# Patient Record
Sex: Male | Born: 1990 | Race: Black or African American | Hispanic: No | Marital: Married | Smoking: Former smoker
Health system: Southern US, Community
[De-identification: ages and names within clinical notes are randomized; demographics above are authoritative.]

## PROBLEM LIST (undated history)

## (undated) DIAGNOSIS — J309 Allergic rhinitis, unspecified: Secondary | ICD-10-CM

## (undated) DIAGNOSIS — S93326A Dislocation of tarsometatarsal joint of unspecified foot, initial encounter: Secondary | ICD-10-CM

## (undated) DIAGNOSIS — J3501 Chronic tonsillitis: Secondary | ICD-10-CM

## (undated) HISTORY — DX: Chronic tonsillitis: J35.01

## (undated) HISTORY — DX: Dislocation of tarsometatarsal joint of unspecified foot, initial encounter: S93.326A

## (undated) HISTORY — PX: NO PAST SURGERIES: SHX2092

## (undated) HISTORY — DX: Allergic rhinitis, unspecified: J30.9

---
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
## 2012-03-22 ENCOUNTER — Encounter (HOSPITAL_BASED_OUTPATIENT_CLINIC_OR_DEPARTMENT_OTHER): Payer: Self-pay | Admitting: *Deleted

## 2012-03-22 ENCOUNTER — Emergency Department (INDEPENDENT_AMBULATORY_CARE_PROVIDER_SITE_OTHER): Payer: Managed Care, Other (non HMO)

## 2012-03-22 ENCOUNTER — Emergency Department (HOSPITAL_BASED_OUTPATIENT_CLINIC_OR_DEPARTMENT_OTHER)
Admission: EM | Admit: 2012-03-22 | Discharge: 2012-03-22 | Disposition: A | Discharge status: Home / Self Care | Payer: Managed Care, Other (non HMO) | Attending: Emergency Medicine | Admitting: Emergency Medicine

## 2012-03-22 DIAGNOSIS — J40 Bronchitis, not specified as acute or chronic: Secondary | ICD-10-CM | POA: Insufficient documentation

## 2012-03-22 DIAGNOSIS — R509 Fever, unspecified: Secondary | ICD-10-CM

## 2012-03-22 DIAGNOSIS — R05 Cough: Secondary | ICD-10-CM

## 2012-03-22 MED ORDER — ALBUTEROL SULFATE HFA 108 (90 BASE) MCG/ACT IN AERS
2.0000 | INHALATION_SPRAY | RESPIRATORY_TRACT | Status: DC | PRN
  Administered 2012-03-22: 2 via RESPIRATORY_TRACT
  Filled 2012-03-22: qty 6.7

## 2012-03-22 NOTE — ED Provider Notes (Signed)
History     CSN: [id number]  Arrival date & time 03/22/12  1311   First MD Initiated Contact with Patient 03/22/12 1346      Chief Complaint  Patient presents with  . Fever    (Consider location/radiation/quality/duration/timing/severity/associated sxs/prior treatment) HPI Pt presents with c/o cough, nasal congestion, body aches and subjective fever.  Symptoms began this morning.  He has not taken anything for his symptoms prior to arrival.  Symptoms described as mild.  He has been able to drink and eat normal, no difficulty breathing or swallowing.  He has no specific sick contacts.  There are no alleviating or modifying factors.  Symptoms are continuous, cough is nonproductive.  History reviewed. No pertinent past medical history.  History reviewed. No pertinent past surgical history.  No family history on file.  History  Substance Use Topics  . Smoking status: Never Smoker   . Smokeless tobacco: Not on file  . Alcohol Use: No      Review of Systems ROS reviewed and all otherwise negative except for mentioned in HPI  Allergies  Review of patient's allergies indicates no known allergies.  Home Medications  No current outpatient prescriptions on file.  BP 119/66  Pulse 84  Temp(Src) 99.8 F (37.7 C) (Oral)  Resp 18  SpO2 100% Vitals reviewed Physical Exam Physical Examination: General appearance - alert, well appearing, and in no distress Mental status - alert, oriented to person, place, and time Mouth - mucous membranes moist, pharynx normal without lesions Neck - supple, no significant adenopathy Chest - clear to auscultation, no wheezes, rales or rhonchi, symmetric air entry, no increased respiratory effort or dyspnea Heart - normal rate, regular rhythm, normal S1, S2, no murmurs, rubs, clicks or gallops Abdomen - soft, nontender, nondistended, no masses or organomegaly Extremities - peripheral pulses normal, no pedal edema, no clubbing or cyanosis Skin -  normal coloration and turgor, no rashes, brisk cap refill  ED Course  Procedures (including critical care time)  Labs Reviewed - No data to display Dg Chest 2 View  03/22/2012  *RADIOLOGY REPORT*  Clinical Data: Fever with cough  CHEST - 2 VIEW  Comparison: None.  Findings: Heart and mediastinal contours are within normal limits. The lung fields appear clear with no signs of focal infiltrate or congestive failure.  No pleural fluid or significant peribronchial cuffing is seen.  Bony structures appear intact  IMPRESSION: No worrisome focal or acute cardiopulmonary abnormality noted.  Original Report Authenticated By: Bertha Stakes, M.D.     1. Bronchitis       MDM  Pt presents with nasal congestion,cough, subjective fever.  CXR reveals no acute abnormalities, pt is not in any distress and overall appears nontoxic and well hydrated.  He was provided with an albuterol for symptomatic relief of possible bronchitis.  Also discussed symptomatic treatment and the importance of hydration. Pt discharged with strict return precautions.  He is agreeable with this plan.         Ethelda Chick, MD 03/22/12 [phone]

## 2012-03-22 NOTE — ED Notes (Signed)
Pt states one day history of fever chills and cough.

## 2012-03-22 NOTE — Discharge Instructions (Signed)
Return to the ED with any concerns including difficulty breathing or swallowing, vomiting and not able to keep down liquids, decreased level of alertness/lethargy, or any other alarming symptoms  You should use the albuterol 2 puffs every 4 hours for cough  Be sure to drink plenty of fluids and arrange for a recheck with your primary care doctor if your symptoms do not improve

## 2012-03-22 NOTE — ED Notes (Signed)
Woke with a fever, cough and headache.  Has not taken any otc medications.

## 2012-07-10 IMAGING — CR DG FOOT COMPLETE 3+V*R*
3 series · 3 of 3 positions shown · non-contrast
Comparison: None.

CLINICAL DATA: Twisted foot/ankle yesterday.  Distal lateral
metatarsal pain.

RIGHT FOOT COMPLETE - 3+ VIEW

[t foot ap right]
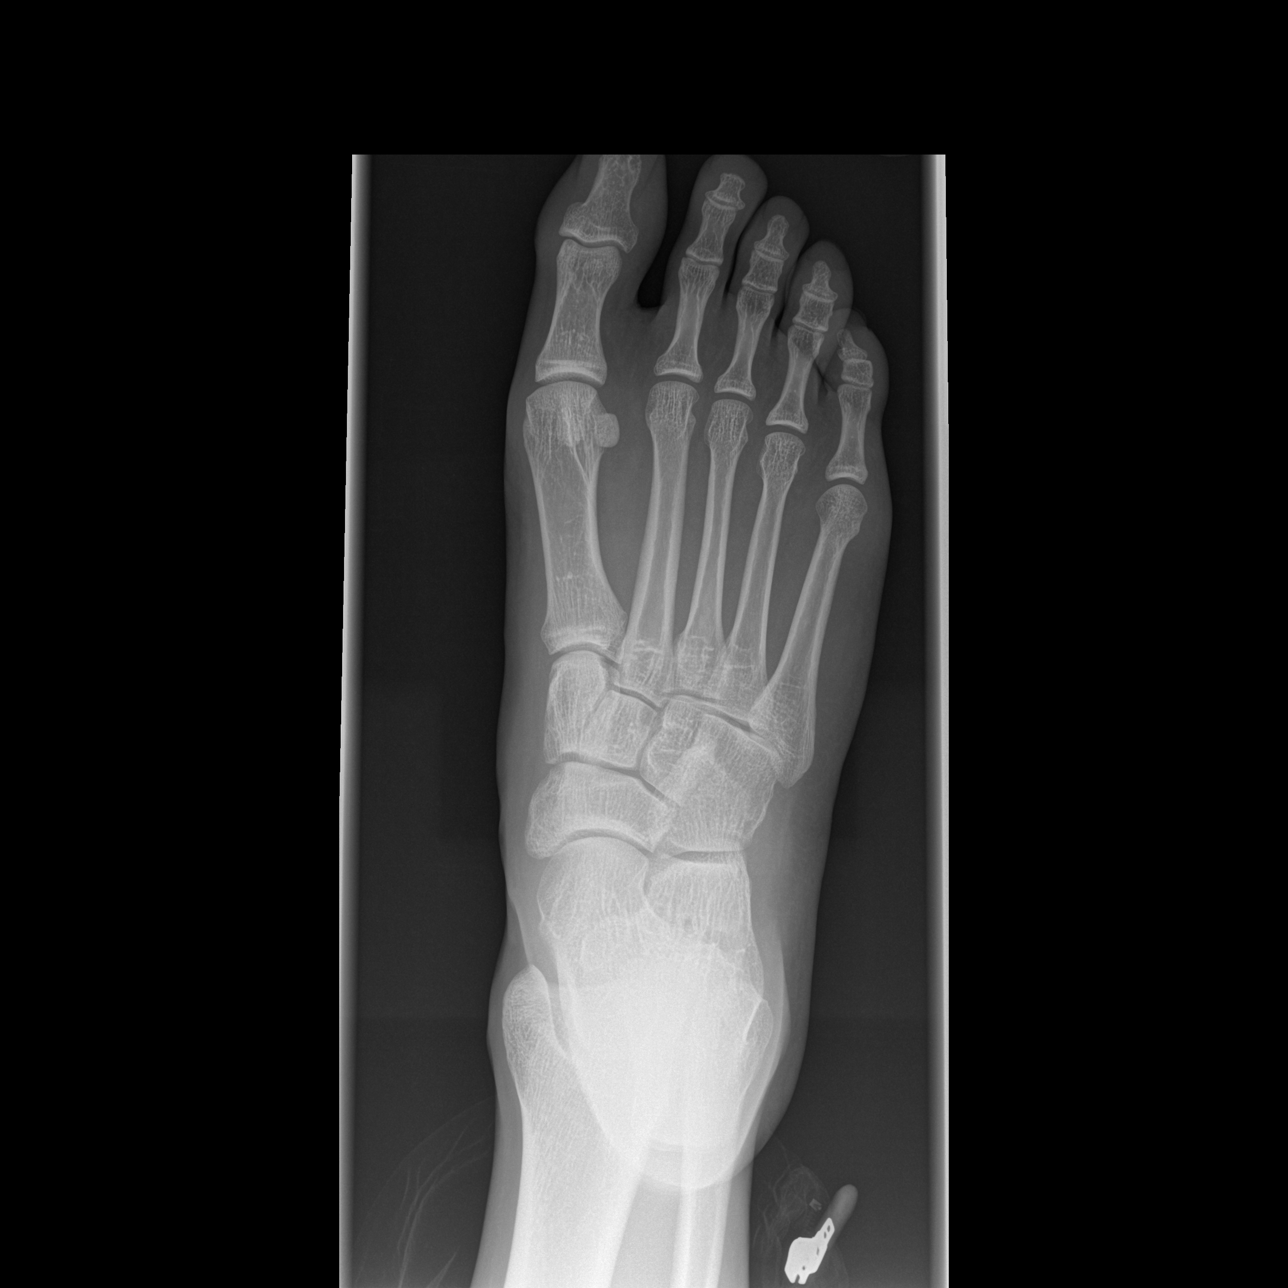

[t foot oblique right]
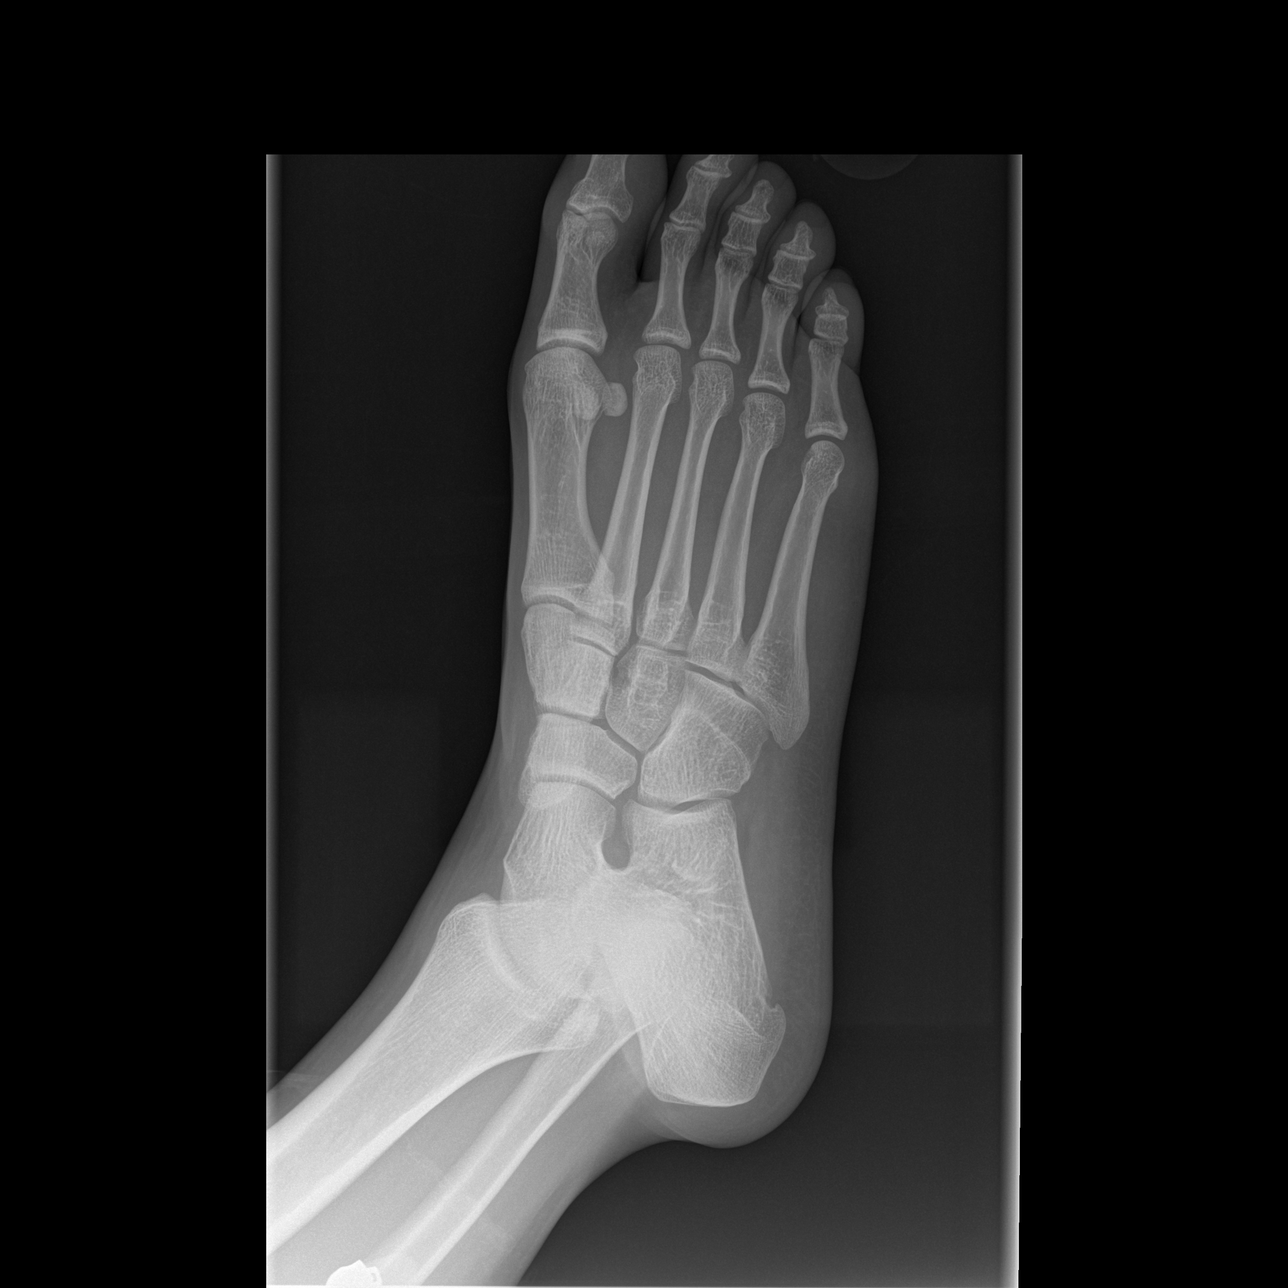

[t foot lat right]
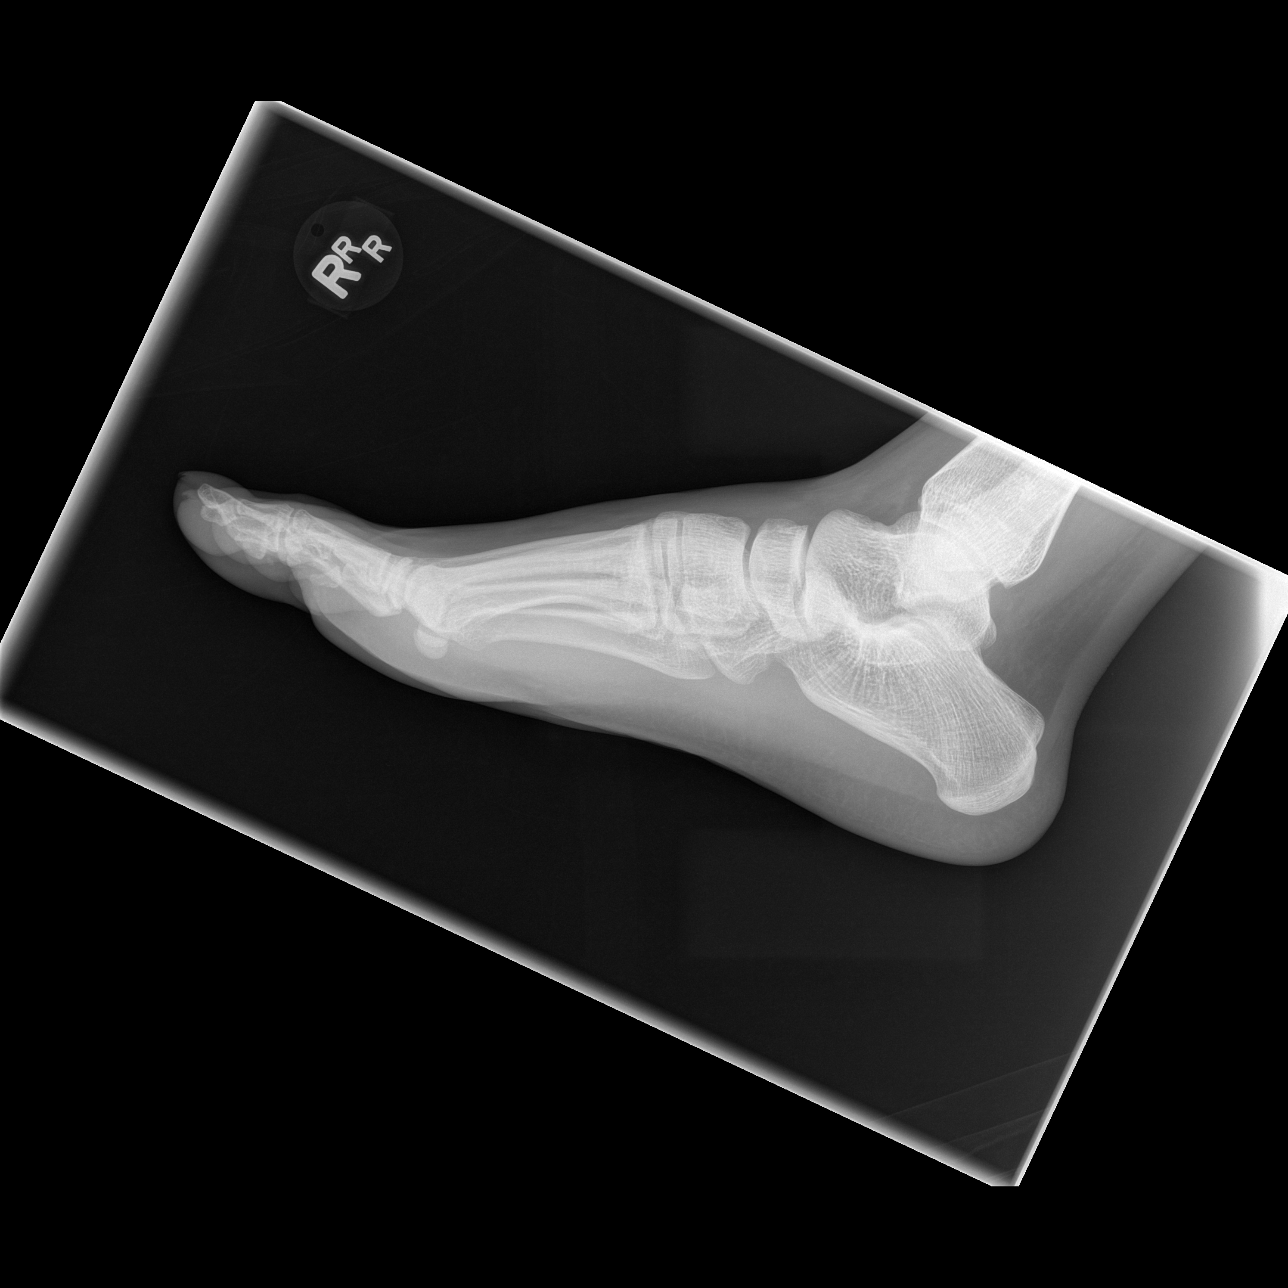

[3 of 3 positions shown; findings below may reference images not displayed]

FINDINGS: No acute fracture or dislocation.  No significant soft
tissue swelling.
IMPRESSION: No acute findings about the right foot.

## 2012-09-20 ENCOUNTER — Ambulatory Visit (INDEPENDENT_AMBULATORY_CARE_PROVIDER_SITE_OTHER): Payer: Managed Care, Other (non HMO) | Admitting: *Deleted

## 2012-09-20 DIAGNOSIS — Z23 Encounter for immunization: Secondary | ICD-10-CM

## 2012-09-20 DIAGNOSIS — Z0000 Encounter for general adult medical examination without abnormal findings: Secondary | ICD-10-CM

## 2013-02-17 ENCOUNTER — Ambulatory Visit (INDEPENDENT_AMBULATORY_CARE_PROVIDER_SITE_OTHER): Payer: Managed Care, Other (non HMO) | Admitting: Internal Medicine

## 2013-02-17 ENCOUNTER — Encounter: Payer: Self-pay | Admitting: Internal Medicine

## 2013-02-17 VITALS — BP 112/74 | HR 58 | Temp 97.9°F | Wt 172.0 lb

## 2013-02-17 DIAGNOSIS — H6120 Impacted cerumen, unspecified ear: Secondary | ICD-10-CM

## 2013-02-17 DIAGNOSIS — R109 Unspecified abdominal pain: Secondary | ICD-10-CM

## 2013-02-17 DIAGNOSIS — J309 Allergic rhinitis, unspecified: Secondary | ICD-10-CM

## 2013-02-17 MED ORDER — FLUTICASONE PROPIONATE 50 MCG/ACT NA SUSP: 2.0000 | Freq: Every day | NASAL | Status: DC

## 2013-02-17 NOTE — Progress Notes (Signed)
  Subjective:    Patient ID: Ronald Patrick, male    DOB: April 06, 1991, 22 y.o.   MRN: [id number]  HPI Acute visit, mom is in the lobby, she is concerned about the following issues: Several weeks history of clearing his throat frequently. Review of systems negative, see below. Also, 2 weeks ago had an episode of abdominal pain, ill-defined, lasted a few hours, in the middle of the abdomen. No further episodes. Mom also says that he "takes a long time in the bathroom", pt denies constipation, nl BMs qd   Past Medical History  Diagnosis Date  . Chronic tonsillitis    Past Surgical History  Procedure Laterality Date  . No past surgeries     History   Social History  . Marital Status: Single    Spouse Name: N/A    Number of Children: 0  . Years of Education: N/A   Occupational History  . to start [employer] 07-2013    Social History Main Topics  . Smoking status: Never Smoker   . Smokeless tobacco: Never Used  . Alcohol Use: No  . Drug Use: No  . Sexually Active: Not on file   Other Topics Concern  . Not on file   Social History Narrative   Finished high school 2012     Review of Systems Denies runny nose, sore throat. No sneezing, itchy eyes or itchy nose. No heartburn per se. Denies any nausea, vomiting, diarrhea. Appetite is okay.    Objective:   Physical Exam General -- alert, well-developed, healthy appearing.    HEENT -- ++ wax on the R, normal TM on the left. Throat w/o redness, face symmetric and not tender to palpation, nose clear Lungs -- normal respiratory effort, no intercostal retractions, no accessory muscle use, and normal breath sounds.   Heart-- normal rate, regular rhythm, no murmur, and no gallop.   Abdomen--soft, non-tender, no distention, no masses, no HSM, no guarding, and no rigidity.   Extremities-- no pretibial edema bilaterally  Neurologic-- alert & oriented X3 and strength normal in all extremities. Psych-- Cognition and judgment appear intact.  Alert and cooperative with normal attention span and concentration.  not anxious appearing and not depressed appearing.       Assessment & Plan:  --"Clearing her throat ", etiology unclear, allergies?. Trial with Flonase. --Cerumen impaction,  Lavage performed, normal ear past lavage recommend to use peroxide as needed. --Single episode of abdominal pain, resolved.

## 2013-02-17 NOTE — Patient Instructions (Addendum)
Flonase, 2 sprays on each side of the nose daily for at least 6 weeks. Use peroxide, few drops in each ear 3 times a week to prevent wax accumulation. Please schedule a physical at your convenience

## 2013-04-10 ENCOUNTER — Ambulatory Visit: Payer: Managed Care, Other (non HMO) | Admitting: Family Medicine

## 2013-04-15 ENCOUNTER — Emergency Department (HOSPITAL_BASED_OUTPATIENT_CLINIC_OR_DEPARTMENT_OTHER): Payer: Managed Care, Other (non HMO)

## 2013-04-15 ENCOUNTER — Encounter (HOSPITAL_BASED_OUTPATIENT_CLINIC_OR_DEPARTMENT_OTHER): Payer: Self-pay

## 2013-04-15 ENCOUNTER — Emergency Department (HOSPITAL_BASED_OUTPATIENT_CLINIC_OR_DEPARTMENT_OTHER)
Admission: EM | Admit: 2013-04-15 | Discharge: 2013-04-15 | Disposition: A | Discharge status: Home / Self Care | Payer: Managed Care, Other (non HMO) | Attending: Emergency Medicine | Admitting: Emergency Medicine

## 2013-04-15 DIAGNOSIS — R059 Cough, unspecified: Secondary | ICD-10-CM | POA: Insufficient documentation

## 2013-04-15 DIAGNOSIS — J309 Allergic rhinitis, unspecified: Secondary | ICD-10-CM | POA: Insufficient documentation

## 2013-04-15 DIAGNOSIS — R05 Cough: Secondary | ICD-10-CM | POA: Insufficient documentation

## 2013-04-15 DIAGNOSIS — Z8709 Personal history of other diseases of the respiratory system: Secondary | ICD-10-CM | POA: Insufficient documentation

## 2013-04-15 DIAGNOSIS — R0982 Postnasal drip: Secondary | ICD-10-CM | POA: Insufficient documentation

## 2013-04-15 DIAGNOSIS — J3489 Other specified disorders of nose and nasal sinuses: Secondary | ICD-10-CM | POA: Insufficient documentation

## 2013-04-15 NOTE — ED Notes (Signed)
Mother states that pt has had dry hacking cough x1 month.  Has been to PCP and was given medications but sx are not improving.

## 2013-04-15 NOTE — ED Provider Notes (Signed)
History     CSN: [id number]  Arrival date & time 04/15/13  1415   First MD Initiated Contact with Patient 04/15/13 1416      Chief Complaint  Patient presents with  . Cough    (Consider location/radiation/quality/duration/timing/severity/associated sxs/prior treatment) Patient is a 22 y.o. male presenting with cough. The history is provided by the patient. No language interpreter was used.  Cough Associated symptoms: rhinorrhea   Associated symptoms: no chest pain, no chills, no diaphoresis, no ear pain, no fever, no shortness of breath and no sore throat   Pt is a 22yo male c/o dry hacking cough x56mo.  Pt accompanied by mother who is nervous the cough has lasted for 2mo.  State he has tried Claritin and Claritin D prescribed by PCP, with some relief.  Was also prescribed Flonase but never tried.  States he did have a headache and sore throat when cough first started last month but both have gone away after using Claritin.  Reports nasal congestion. Denies ear pain, chest pain, SOB, or abdominal pain.  Denies fever, n/v/d.  No hx of asthma.  States he is generally healthy.  Has not tried anything more for the cough.  No recent travel or sick contacts.     Past Medical History  Diagnosis Date  . Chronic tonsillitis     Past Surgical History  Procedure Laterality Date  . No past surgeries      History reviewed. No pertinent family history.  History  Substance Use Topics  . Smoking status: Never Smoker   . Smokeless tobacco: Never Used  . Alcohol Use: No      Review of Systems  Constitutional: Negative for fever, chills, diaphoresis and fatigue.  HENT: Positive for congestion, rhinorrhea and postnasal drip. Negative for ear pain, sore throat, sneezing, trouble swallowing, voice change and sinus pressure.   Respiratory: Positive for cough. Negative for chest tightness, shortness of breath and stridor.   Cardiovascular: Negative for chest pain.  Gastrointestinal: Negative  for nausea, vomiting, abdominal pain and diarrhea.    Allergies  Review of patient's allergies indicates no known allergies.  Home Medications   Current Outpatient Rx  Name  Route  Sig  Dispense  Refill  . loratadine (CLARITIN) 10 MG tablet   Oral   Take 10 mg by mouth daily.         . fluticasone (FLONASE) 50 MCG/ACT nasal spray   Nasal   Place 2 sprays into the nose daily.   16 g   6     BP 108/69  Pulse 69  Temp(Src) 98.4 F (36.9 C) (Oral)  Resp 18  Ht 6\' 1"  (1.854 m)  Wt 165 lb (74.844 kg)  BMI 21.77 kg/m2  SpO2 98%  Physical Exam  Nursing note and vitals reviewed. Constitutional: He appears well-developed and well-nourished. No distress.  HENT:  Head: Normocephalic and atraumatic. No trismus in the jaw.  Right Ear: Hearing, tympanic membrane, external ear and ear canal normal. Tympanic membrane is not erythematous. No decreased hearing is noted.  Left Ear: Hearing, tympanic membrane, external ear and ear canal normal. Tympanic membrane is not erythematous. No decreased hearing is noted.  Nose: Mucosal edema present. No rhinorrhea, sinus tenderness, septal deviation or nasal septal hematoma. No epistaxis.  No foreign bodies. Right sinus exhibits no maxillary sinus tenderness and no frontal sinus tenderness. Left sinus exhibits no maxillary sinus tenderness and no frontal sinus tenderness.  Mouth/Throat: Uvula is midline and mucous membranes are normal.  He does not have dentures. No oral lesions. Normal dentition. No dental abscesses, edematous, lacerations or dental caries. Posterior oropharyngeal edema ( mild) and posterior oropharyngeal erythema ( mild) present. No oropharyngeal exudate or tonsillar abscesses.  Eyes: Conjunctivae are normal. Right eye exhibits no discharge. Left eye exhibits no discharge. No scleral icterus.  Neck: Normal range of motion. Neck supple. No JVD present. No tracheal deviation present. No thyromegaly present.  Cardiovascular: Normal  rate, regular rhythm and normal heart sounds.   Pulmonary/Chest: Effort normal and breath sounds normal. No stridor. No respiratory distress. He has no wheezes. He has no rales. He exhibits no tenderness.  Abdominal: Soft. Bowel sounds are normal. He exhibits no distension. There is no tenderness.  Musculoskeletal: Normal range of motion.  Lymphadenopathy:    He has no cervical adenopathy.  Neurological: He is alert.  Skin: Skin is warm and dry. He is not diaphoretic.    ED Course  Procedures (including critical care time)  Labs Reviewed - No data to display Dg Chest 2 View  04/15/2013  *RADIOLOGY REPORT*  Clinical Data: Cough, congestion and shortness of breath.  CHEST - 2 VIEW  Comparison: 03/22/2012  Findings: The cardiomediastinal silhouette is unremarkable. Mild peribronchial thickening is unchanged. There is no evidence of focal airspace disease, pulmonary edema, suspicious pulmonary nodule/mass, pleural effusion, or pneumothorax. No acute bony abnormalities are identified.  IMPRESSION: No evidence of active cardiopulmonary disease.   Original Report Authenticated By: Harmon Pier, M.D.      1. Allergic rhinitis   2. Cough       MDM  Pt c/o 45mo dry hacking cough,  Has noticed some relief with Claritin D prescribed by PCP.  Was prescribed Flonase but never used it.  Accompanied by mother who wants to make sure nothing serious is going on.  Denies hx of asthma.  Denies daily medications besides Claritin.  Denies fever, n/v/d, sore throat, or headaches.  PE: well appearing, Ears/TMs-nl, Nose-erythemic, edematous mucosa, Throat-mild erythema and tonsillar hypertrophy w/o exudate. Neck-FROM, supple. Heart-nl, Lungs-CTAB, no wheezes or rhonchi.  No chest tenderness. Abd-nl  Believe cough is due to post-nasal drip and allergic rhinitis.  Will obtain CXR to ensure not pneumonia.  Not concerned for meningitis.   Rx: none- advised pt to continue taking recently prescribed Claritin and start  taking Flonase as prescribed by Dr. Drue Novel, PCP.    Reassured pt and mother that cough is likely due to allergic rhinitis.  CXR was normal.  Cough is non-productive.  Pt not c/o fever, headache or sinus pressure.  Pt does have inflammed nasal mucosa and visible post-nasal drip.  Gave pt, pt education for allergic rhinitis including saline rinses.  Will have pt f/u with Dr. Drue Novel in 2wks if symptoms persistent after consistent use of prescribed medications.   Vitals: unremarkable. Discharged in stable condition.    Discussed pt with attending during ED encounter.             Junius Finner, PA-C 04/15/13 1511

## 2013-04-17 NOTE — ED Provider Notes (Signed)
Medical screening examination/treatment/procedure(s) were performed by non-physician practitioner and as supervising physician I was immediately available for consultation/collaboration.  Orey Moure, MD 04/17/13 0809 

## 2013-04-29 ENCOUNTER — Ambulatory Visit (INDEPENDENT_AMBULATORY_CARE_PROVIDER_SITE_OTHER): Payer: Managed Care, Other (non HMO) | Admitting: Internal Medicine

## 2013-04-29 ENCOUNTER — Encounter: Payer: Self-pay | Admitting: Internal Medicine

## 2013-04-29 VITALS — BP 110/70 | HR 70 | Temp 98.1°F | Resp 16 | Ht 72.0 in | Wt 164.1 lb

## 2013-04-29 DIAGNOSIS — Z0000 Encounter for general adult medical examination without abnormal findings: Secondary | ICD-10-CM

## 2013-04-29 DIAGNOSIS — J309 Allergic rhinitis, unspecified: Secondary | ICD-10-CM

## 2013-04-29 DIAGNOSIS — Z23 Encounter for immunization: Secondary | ICD-10-CM

## 2013-04-29 NOTE — Assessment & Plan Note (Signed)
Immunizations, he is update except for Menactra which is provided today Counseling about diet, exercise, safe driving, safe sex, STE, risks of tobacco-alcohol-drugs. Labs

## 2013-04-29 NOTE — Patient Instructions (Signed)
In addition to Flonase, take Claritin 10 mg OTC every night. If your allergies are not better let me know.  Safer Sex Your caregiver wants you to have this information about the infections that can be transmitted from sexual contact and how to prevent them. The idea behind safer sex is that you can be sexually active, and at the same time reduce the risk of giving or getting a sexually transmitted disease (STD). Every person should be aware of how to prevent him or herself and his or her sex partner from getting an STD. CAUSES OF STDS STDs are transmitted by sharing body fluids, which contain viruses and bacteria. The following fluids all transmit infections during sexual intercourse and sex acts:  Semen.  Saliva.  Urine.  Blood.  Vaginal mucus. Examples of STDs include:  Chlamydia.  Gonorrhea.  Genital herpes.  Hepatitis B.  Human immunodeficiency virus or acquired immunodeficiency syndrome (HIV or AIDS).  Syphilis.  Trichomonas.  Pubic lice.  Human papillomavirus (HPV), which may include:  Genital warts.  Cervical dysplasia.  Cervical cancer (can develop with certain types of HPV). SYMPTOMS  Sexual diseases often cause few or no symptoms until they are advanced, so a person can be infected and spread the infection without knowing it. Some STDs respond to treatment very well. Others, like HIV and herpes, cannot be cured, but are treated to reduce their effects. Specific symptoms include:  Abnormal vaginal discharge.  Irritation or itching in and around the vagina, and in the pubic hair.  Pain during sexual intercourse.  Bleeding during sexual intercourse.  Pelvic or abdominal pain.  Fever.  Growths in and around the vagina.  An ulcer in or around the vagina.  Swollen glands in the groin area. DIAGNOSIS   Blood tests.  Pap test.  Culture test of abnormal vaginal discharge.  A test that applies a solution and examines the cervix with a lighted  magnifying scope (colposcopy).  A test that examines the pelvis with a lighted tube, through a small incision (laparoscopy). TREATMENT  The treatment will depend on the cause of the STD.  Antibiotic treatment by injection, oral, creams, or suppositories in the vagina.  Over-the-counter medicated shampoo, to get rid of pubic lice.  Removing or treating growths with medicine, freezing, burning (electrocautery), or surgery.  Surgery treatment for HPV of the cervix.  Supportive medicines for herpes, HIV, AIDS, and hepatitis. Being careful cannot eliminate all risk of infection, but sex can be made much safer. Safe sexual practices include body massage and gentle touching. Masturbation is safe, as long as body fluids do not contact skin that has sores or cuts. Dry kissing and oral sex on a man wearing a latex condom or on a woman wearing a male condom is also safe. Slightly less safe is intercourse while the man wears a latex condom or wet kissing. It is also safer to have one sex partner that you know is not having sex with anyone else. LENGTH OF ILLNESS An STD might be treated and cured in a week, sometimes a month, or more. And it can linger with symptoms for many years. STDs can also cause damage to the male organs. This can cause chronic pain, infertility, and recurrence of the STD, especially herpes, hepatitis, HIV, and HPV. HOME CARE INSTRUCTIONS AND PREVENTION  Alcohol and recreational drugs are often the reason given for not practicing safer sex. These substances affect your judgment. Alcohol and recreational drugs can also impair your immune system, making you more  vulnerable to disease.  Do not engage in risky and dangerous sexual practices, including:  Vaginal or anal sex without a condom.  Oral sex on a man without a condom.  Oral sex on a woman without a male condom.  Using saliva to lubricate a condom.  Any other sexual contact in which body fluids or blood from one  partner contact the other partner.  You should use only latex condoms for men and water soluble lubricants. Petroleum based lubricants or oils used to lubricate a condom will weaken the condom and increase the chance that it will break.  Think very carefully before having sex with anyone who is high risk for STDs and HIV. This includes IV drug users, people with multiple sexual partners, or people who have had an STD, or a positive hepatitis or HIV blood test.  Remember that even if your partner has had only one previous partner, their previous partner might have had multiple partners. If so, you are at high risk of being exposed to an STD. You and your sex partner should be the only sex partners with each other, with no one else involved.  A vaccine is available for hepatitis B and HPV through your caregiver or the Public Health Department. Everyone should be vaccinated with these vaccines.  Avoid risky sex practices. Sex acts that can break the skin make you more likely to get an STD. SEEK MEDICAL CARE IF:   If you think you have an STD, even if you do not have any symptoms. Contact your caregiver for evaluation and treatment, if needed.  You think or know your sex partner has acquired an STD.  You have any of the symptoms mentioned above. Document Released: 01/18/2005 Document Revised: 03/04/2012 Document Reviewed: 11/10/2009 [facility] Patient Information 2013 [place], [place].

## 2013-04-29 NOTE — Progress Notes (Signed)
  Subjective:    Patient ID: Ronald Patrick, male    DOB: September 05, 1991, 22 y.o.   MRN: [id number]  HPI CPX  Past Medical History  Diagnosis Date  . Chronic tonsillitis   . Allergic rhinitis    Past Surgical History  Procedure Laterality Date  . No past surgeries     History   Social History  . Marital Status: Single    Spouse Name: N/A    Number of Children: 0  . Years of Education: N/A   Occupational History  . to start [employer] 07-2013    Social History Main Topics  . Smoking status: Never Smoker   . Smokeless tobacco: Never Used  . Alcohol Use: Yes     Comment: rarely  . Drug Use: No  . Sexually Active: Not on file   Other Topics Concern  . Not on file   Social History Narrative   Finished high school 2012 .   Diet: regular     Exercise: very active   Family History  Problem Relation Age of Onset  . CAD Neg Hx   . Diabetes Other     GM, late onset  . Colon cancer Neg Hx   . Prostate cancer Neg Hx     Review of Systems In general doing well. He is fairly active, no chest pain, shortness or breath or syncope. No nausea, vomiting, diarrhea blood in the stools. No dysuria or gross hematuria. No anxiety or depression. Allergy symptoms are not completely well-controlled, he is not taking Claritin, good compliance with Flonase    Objective:   Physical Exam BP 110/70  Pulse 70  Temp(Src) 98.1 F (36.7 C) (Oral)  Resp 16  Ht 6' (1.829 m)  Wt 164 lb 2 oz (74.447 kg)  BMI 22.25 kg/m2  SpO2 97%  General -- alert, well-developed. No apparent distress.  Neck --no thyromegaly HEENT -- nose not congested  Lungs -- normal respiratory effort, no intercostal retractions, no accessory muscle use, and normal breath sounds.   Heart-- normal rate, regular rhythm, no murmur, and no gallop.   Abdomen--soft, non-tender, no distention, no masses, no HSM, no guarding, and no rigidity.   Extremities-- no pretibial edema bilaterally  Neurologic-- alert & oriented X3 and  strength normal in all extremities. Psych-- Cognition and judgment appear intact. Alert and cooperative with normal attention span and concentration.  not anxious appearing and not depressed appearing.       Assessment & Plan:

## 2013-04-29 NOTE — Assessment & Plan Note (Addendum)
Symptoms not completely well-controlled, recommend to restart Claritin

## 2013-05-02 ENCOUNTER — Other Ambulatory Visit: Payer: Managed Care, Other (non HMO)

## 2013-05-08 ENCOUNTER — Encounter: Payer: Self-pay | Admitting: *Deleted

## 2013-05-08 ENCOUNTER — Telehealth: Payer: Self-pay | Admitting: *Deleted

## 2013-05-08 ENCOUNTER — Other Ambulatory Visit: Payer: Managed Care, Other (non HMO)

## 2013-05-08 DIAGNOSIS — E0590 Thyrotoxicosis, unspecified without thyrotoxic crisis or storm: Secondary | ICD-10-CM

## 2013-05-08 LAB — COMPREHENSIVE METABOLIC PANEL
AST: 17 U/L (ref 0–37)
Albumin: 4.2 g/dL (ref 3.5–5.2)
Alkaline Phosphatase: 45 U/L (ref 39–117)
BUN: 19 mg/dL (ref 6–23)
Potassium: 3.8 mEq/L (ref 3.5–5.1)
Sodium: 139 mEq/L (ref 135–145)
Total Protein: 7 g/dL (ref 6.0–8.3)

## 2013-05-08 LAB — CBC WITH DIFFERENTIAL/PLATELET
Basophils Absolute: 0 10*3/uL (ref 0.0–0.1)
Basophils Relative: 0.4 % (ref 0.0–3.0)
Eosinophils Absolute: 0.1 10*3/uL (ref 0.0–0.7)
Lymphocytes Relative: 42.3 % (ref 12.0–46.0)
MCHC: 33.3 g/dL (ref 30.0–36.0)
Neutrophils Relative %: 47.9 % (ref 43.0–77.0)
RBC: 4.59 Mil/uL (ref 4.22–5.81)

## 2013-05-08 LAB — LIPID PANEL
LDL Cholesterol: 68 mg/dL (ref 0–99)
Total CHOL/HDL Ratio: 2
Triglycerides: 83 mg/dL (ref 0.0–149.0)

## 2013-05-08 NOTE — Telephone Encounter (Signed)
Message copied by Nada Maclachlan on Thu May 08, 2013  3:39 PM ------      Message from: Willow Ora E      Created: Thu May 08, 2013  3:21 PM       Send a letter      Ronald Patrick,      Your liver, kidney, potassium and cholesterol are excellent.      Your white cells are slightly elevated, we need to recheck next year.      Your thyroid may be slightly overworking, please come back in 3 months for blood work only. ( TSH, free T3 free T4 --dx hyperthyroidism).      Overall good results! ------

## 2013-08-12 ENCOUNTER — Telehealth: Payer: Self-pay | Admitting: Internal Medicine

## 2013-08-12 DIAGNOSIS — R7989 Other specified abnormal findings of blood chemistry: Secondary | ICD-10-CM

## 2013-08-12 NOTE — Telephone Encounter (Signed)
Advise patient needs labs. TSH, free T3, free T4--- dx abnormal TSH

## 2013-08-13 NOTE — Telephone Encounter (Signed)
lmovm to return call. Orders placed.

## 2013-08-14 NOTE — Telephone Encounter (Signed)
Spoke with pt. Grandmother, asked for pt. To please call office.

## 2013-08-15 ENCOUNTER — Other Ambulatory Visit: Payer: Managed Care, Other (non HMO)

## 2013-08-15 NOTE — Telephone Encounter (Signed)
Left detailed message on home phone per DPR to make pt. Aware of need to schedule lab work and contact office. Mailed letter as well.  Orders have already been placed.

## 2013-08-18 ENCOUNTER — Other Ambulatory Visit: Payer: Managed Care, Other (non HMO)

## 2013-08-20 ENCOUNTER — Other Ambulatory Visit (INDEPENDENT_AMBULATORY_CARE_PROVIDER_SITE_OTHER): Payer: Managed Care, Other (non HMO)

## 2013-08-20 ENCOUNTER — Other Ambulatory Visit: Payer: Managed Care, Other (non HMO)

## 2013-08-20 DIAGNOSIS — E0590 Thyrotoxicosis, unspecified without thyrotoxic crisis or storm: Secondary | ICD-10-CM

## 2013-08-21 LAB — TSH: TSH: 1.52 u[IU]/mL (ref 0.35–5.50)

## 2013-08-21 LAB — T4, FREE: Free T4: 0.99 ng/dL (ref 0.60–1.60)

## 2013-08-21 LAB — T3, FREE: T3, Free: 3.3 pg/mL (ref 2.3–4.2)

## 2013-08-27 ENCOUNTER — Encounter: Payer: Self-pay | Admitting: *Deleted

## 2013-11-28 ENCOUNTER — Encounter: Payer: Self-pay | Admitting: Family Medicine

## 2013-11-28 ENCOUNTER — Ambulatory Visit (INDEPENDENT_AMBULATORY_CARE_PROVIDER_SITE_OTHER): Payer: BC Managed Care – PPO | Admitting: Family Medicine

## 2013-11-28 VITALS — BP 122/72 | HR 76 | Temp 98.5°F | Resp 16 | Wt 189.0 lb

## 2013-11-28 DIAGNOSIS — J029 Acute pharyngitis, unspecified: Secondary | ICD-10-CM

## 2013-11-28 DIAGNOSIS — J069 Acute upper respiratory infection, unspecified: Secondary | ICD-10-CM | POA: Insufficient documentation

## 2013-11-28 NOTE — Progress Notes (Signed)
   Subjective:    Patient ID: Tsosie Billing, male    DOB: 1991-07-16, 22 y.o.   MRN: [id number]  HPI Pre visit review using our clinic review tool, if applicable. No additional management support is needed unless otherwise documented below in the visit note.  URI- sxs started 4-5 days ago.  + sore throat, PND.  Mild cough.  No facial pain/pressure.  No ear pain.  No fevers.  No N/V/D.  + sick contacts.  Hx of seasonal allergies.  Not currently on meds.   Review of Systems For ROS see HPI     Objective:   Physical Exam  Vitals reviewed. Constitutional: He appears well-developed and well-nourished. No distress.  HENT:  Head: Normocephalic and atraumatic.  No TTP over sinuses + turbinate edema + PND TMs normal bilaterally  Eyes: Conjunctivae and EOM are normal. Pupils are equal, round, and reactive to light.  Neck: Normal range of motion. Neck supple.  Cardiovascular: Normal rate, regular rhythm and normal heart sounds.   Pulmonary/Chest: Effort normal and breath sounds normal. No respiratory distress. He has no wheezes.  Lymphadenopathy:    He has no cervical adenopathy.  Skin: Skin is warm and dry.          Assessment & Plan:

## 2013-11-28 NOTE — Assessment & Plan Note (Signed)
New.  No evidence of bacterial infxn on PE.  Suspect this is due to viral/allergy combo.  Restart allergy meds.  Reviewed supportive care and red flags that should prompt return.  Pt expressed understanding and is in agreement w/ plan.

## 2013-11-28 NOTE — Patient Instructions (Signed)
Follow up as needed This is a viral illness and should continue to improve w/ time Drink plenty of fluids Start Claritin or Zyrtec daily for nasal congestion and post-nasal drip REST! Call with any questions or concerns Hang in there!!

## 2014-01-06 ENCOUNTER — Encounter: Payer: Self-pay | Admitting: Internal Medicine

## 2014-01-06 ENCOUNTER — Ambulatory Visit (INDEPENDENT_AMBULATORY_CARE_PROVIDER_SITE_OTHER): Payer: BC Managed Care – PPO | Admitting: Internal Medicine

## 2014-01-06 VITALS — BP 117/62 | HR 60 | Temp 98.0°F | Wt 199.0 lb

## 2014-01-06 DIAGNOSIS — Z202 Contact with and (suspected) exposure to infections with a predominantly sexual mode of transmission: Secondary | ICD-10-CM

## 2014-01-06 MED ORDER — DOXYCYCLINE HYCLATE 100 MG PO TABS: 100.0000 mg | ORAL_TABLET | Freq: Two times a day (BID) | ORAL | Status: DC

## 2014-01-06 NOTE — Patient Instructions (Signed)
Get your blood work before you leave       Safe Sex Safe sex is about reducing the risk of giving or getting a sexually transmitted disease (STD). STDs are spread through sexual contact involving the genitals, mouth, or rectum. Some STDS can be cured and others cannot. Safe sex can also prevent unintended pregnancies.  SAFE SEX PRACTICES  Limit your sexual activity to only one partner who is only having sex with you.  Talk to your partner about their past partners, past STDs, and drug use.  Use a condom every time you have sexual intercourse. This includes vaginal, oral, and anal sexual activity. Both females and males should wear condoms during oral sex. Only use latex or polyurethane condoms and water-based lubricants. Petroleum-based lubricants or oils used to lubricate a condom will weaken the condom and increase the chance that it will break. The condom should be in place from the beginning to the end of sexual activity. Wearing a condom reduces, but does not completely eliminate, your risk of getting or giving a STD. STDs can be spread by contact with skin of surrounding areas.  Get vaccinated for hepatitis B and HPV.  Avoid alcohol and recreational drugs which can affect your judgement. You may forget to use a condom or participate in high-risk sex.  For females, avoid douching after sexual intercourse. Douching can spread an infection farther into the reproductive tract.  Check your body for signs of sores, blisters, rashes, or unusual discharge. See your caregiver if you notice any of these signs.  Avoid sexual contact if you have symptoms of an infection or are being treated for an STD. If you or your partner has herpes, avoid sexual contact when blisters are present. Use condoms at all other times.  See your caregiver for regular screenings, examinations, and tests for STDs. Before having sex with a new partner, each of you should be screened for STDs and talk about the results  with your partner. BENEFITS OF SAFE SEX   There is less of a chance of getting or giving an STD.  You can prevent unwanted or unintended pregnancies.  By discussing safer sex concerns with your partner, you may increase feelings of intimacy, comfort, trust, and honesty between the both of you. Document Released: 01/18/2005 Document Revised: 09/04/2012 Document Reviewed: 06/03/2012 [facility] Patient Information 2014 [place], [place].

## 2014-01-06 NOTE — Progress Notes (Signed)
   Subjective:    Patient ID: Ronald Patrick, male    DOB: Sep 02, 1991, 23 y.o.   MRN: [id number]  HPI Acute visit, here with his girlfriend. His GF is pregnant, she was recently diagnosed with Chlamydia and treated yesterday, the last time they were sexually active was 2 days ago. He likes to be treated. Also would like to get screened for sickle cell.  Past Medical History  Diagnosis Date  . Chronic tonsillitis   . Allergic rhinitis    Past Surgical History  Procedure Laterality Date  . No past surgeries     History   Social History  . Marital Status: Single    Spouse Name: N/A    Number of Children: 0  . Years of Education: N/A   Occupational History  .     Social History Main Topics  . Smoking status: Never Smoker   . Smokeless tobacco: Never Used  . Alcohol Use: Yes     Comment: rarely  . Drug Use: No  . Sexual Activity: Not on file   Other Topics Concern  . Not on file   Social History Narrative   Finished high school 2012 .   Diet: regular     Exercise: very active   Review of Systems Denies any dysuria or penile discharge. No change in the color of the urine. No genital rash. Testicular pain or swelling.    Objective:   Physical Exam  BP 117/62  Pulse 60  Temp(Src) 98 F (36.7 C)  Wt 199 lb (90.266 kg)  SpO2 99% General -- alert, well-developed, NAD.  GU-- Penis normal without discharge, rash or lesions. Scrotal contents normal, no significant LADs noted in the groins. Extremities-- no pretibial edema bilaterally  Psych-- Cognition and judgment appear intact. Cooperative with normal attention span and concentration. No anxious or depressed appearing.     Assessment & Plan:  STD exposure Check G&C, HIV RPR Girlfriend was already treated, rec abstinence x 10 day Safe sex! Doxy    Screening for sickle cell: Ok to check for a Hg S but his insurance may not like to pay. All previous  CBCs normal. pt decided to get tested [facility]

## 2014-01-07 ENCOUNTER — Other Ambulatory Visit: Payer: Self-pay | Admitting: *Deleted

## 2014-01-07 LAB — GC/CHLAMYDIA PROBE AMP
CT PROBE, AMP APTIMA: POSITIVE — AB
GC PROBE AMP APTIMA: NEGATIVE

## 2014-01-07 LAB — HIV ANTIBODY (ROUTINE TESTING W REFLEX): HIV: NONREACTIVE

## 2014-01-07 LAB — RPR

## 2014-01-07 MED ORDER — DOXYCYCLINE HYCLATE 100 MG PO TABS: 100.0000 mg | ORAL_TABLET | Freq: Two times a day (BID) | ORAL | Status: DC

## 2014-01-21 ENCOUNTER — Encounter: Payer: Self-pay | Admitting: Internal Medicine

## 2014-01-21 ENCOUNTER — Other Ambulatory Visit: Payer: Self-pay | Admitting: *Deleted

## 2014-01-21 ENCOUNTER — Ambulatory Visit (INDEPENDENT_AMBULATORY_CARE_PROVIDER_SITE_OTHER): Payer: BC Managed Care – PPO | Admitting: Internal Medicine

## 2014-01-21 VITALS — BP 118/78 | HR 80 | Temp 98.0°F | Wt 201.0 lb

## 2014-01-21 DIAGNOSIS — R1031 Right lower quadrant pain: Secondary | ICD-10-CM

## 2014-01-21 DIAGNOSIS — R109 Unspecified abdominal pain: Secondary | ICD-10-CM

## 2014-01-21 DIAGNOSIS — R1013 Epigastric pain: Secondary | ICD-10-CM

## 2014-01-21 DIAGNOSIS — K3189 Other diseases of stomach and duodenum: Secondary | ICD-10-CM

## 2014-01-21 LAB — CBC WITH DIFFERENTIAL/PLATELET
BASOS ABS: 0.1 10*3/uL (ref 0.0–0.1)
Basophils Relative: 1.1 % (ref 0.0–3.0)
Eosinophils Absolute: 0.2 10*3/uL (ref 0.0–0.7)
Eosinophils Relative: 4.1 % (ref 0.0–5.0)
HEMATOCRIT: 46.8 % (ref 39.0–52.0)
HEMOGLOBIN: 15.1 g/dL (ref 13.0–17.0)
LYMPHS ABS: 2.5 10*3/uL (ref 0.7–4.0)
Lymphocytes Relative: 41.9 % (ref 12.0–46.0)
MCHC: 32.4 g/dL (ref 30.0–36.0)
MCV: 98.3 fl (ref 78.0–100.0)
MONO ABS: 0.5 10*3/uL (ref 0.1–1.0)
MONOS PCT: 8.3 % (ref 3.0–12.0)
NEUTROS ABS: 2.7 10*3/uL (ref 1.4–7.7)
Neutrophils Relative %: 44.6 % (ref 43.0–77.0)
Platelets: 211 10*3/uL (ref 150.0–400.0)
RBC: 4.76 Mil/uL (ref 4.22–5.81)
RDW: 13.3 % (ref 11.5–14.6)
WBC: 5.9 10*3/uL (ref 4.5–10.5)

## 2014-01-21 LAB — COMPREHENSIVE METABOLIC PANEL
ALK PHOS: 31 U/L — AB (ref 39–117)
ALT: 18 U/L (ref 0–53)
AST: 19 U/L (ref 0–37)
Albumin: 4.1 g/dL (ref 3.5–5.2)
BILIRUBIN TOTAL: 0.6 mg/dL (ref 0.3–1.2)
BUN: 11 mg/dL (ref 6–23)
CO2: 30 meq/L (ref 19–32)
CREATININE: 1.1 mg/dL (ref 0.4–1.5)
Calcium: 9.3 mg/dL (ref 8.4–10.5)
Chloride: 103 mEq/L (ref 96–112)
GFR: 110.18 mL/min (ref >60.00)
Glucose, Bld: 86 mg/dL (ref 70–99)
Potassium: 4.1 mEq/L (ref 3.5–5.1)
SODIUM: 137 meq/L (ref 135–145)
Total Protein: 7.4 g/dL (ref 6.0–8.3)

## 2014-01-21 LAB — POCT URINALYSIS DIPSTICK
Bilirubin, UA: NEGATIVE
Blood, UA: NEGATIVE
Glucose, UA: NEGATIVE
Ketones, UA: NEGATIVE
LEUKOCYTES UA: NEGATIVE
NITRITE UA: NEGATIVE
PH UA: 7
PROTEIN UA: NEGATIVE
Spec Grav, UA: 1.015
UROBILINOGEN UA: 0.2

## 2014-01-21 NOTE — Patient Instructions (Signed)
Please go to the labs and get your labs gone. Talk with [place] about the  CT

## 2014-01-21 NOTE — Progress Notes (Signed)
   Subjective:    Patient ID: Ronald Patrick, male    DOB: [birth year], 23 y.o.   MRN: [id number]  HPI Acute visit Symptoms started yesterday with mid/right side abdominal pain, initially on and off, no radiation, described as burning; he did vomited 1 hour after he ate. He went to sleep with mild abdominal pain and woke up today with a more intense pain, in the same location, he is afraid to eat so he hasn't eaten today. He was prescribed doxycycline which he finished few days ago.  Past Medical History  Diagnosis Date  . Chronic tonsillitis   . Allergic rhinitis    Past Surgical History  Procedure Laterality Date  . No past surgeries     History   Social History  . Marital Status: Single    Spouse Name: N/A    Number of Children: 0  . Years of Education: N/A   Occupational History  .     Social History Main Topics  . Smoking status: Never Smoker   . Smokeless tobacco: Never Used  . Alcohol Use: Yes     Comment: rarely  . Drug Use: No  . Sexual Activity: Not on file   Other Topics Concern  . Not on file   Social History Narrative   Finished high school 2012 .   Diet: regular     Exercise: very active     Review of Systems Denies fever or chills Last bowel movement was normal, yesterday before the abdominal pain started. Denies dysuria or gross hematuria. No diarrhea or blood in the stools. No cough.    Objective:   Physical Exam  Abdominal:      BP 118/78  Pulse 80  Temp(Src) 98 F (36.7 C)  Wt 201 lb (91.173 kg)  SpO2 100% General -- alert, well-developed, NAD.   HEENT-- Not pale or jaundice Lungs -- normal respiratory effort, no intercostal retractions, no accessory muscle use, and normal breath sounds.  Heart-- normal rate, regular rhythm, no murmur.  Abdomen-- Not distended, good bowel sounds,soft, mildly tender w/o rebound or rigidity. No mass,see graphic   Extremities-- no pretibial edema bilaterally  Neurologic--  alert & oriented X3.  Speech normal, gait normal, strength normal in all extremities.  Psych-- Cognition and judgment appear intact. Cooperative with normal attention span and concentration. No anxious or depressed appearing.        Assessment & Plan:  Abdominal pain, Healthy 23 year old gentleman recently treated for a STD who has a one-day history of abdominal pain, mostly at the right lower quadrant. No fever, had nausea yesterday. Udip neg Acute   appendicitis must be rule out. Plan: CT, CBC CMP further advise w/ results

## 2014-01-22 ENCOUNTER — Telehealth: Payer: Self-pay | Admitting: Internal Medicine

## 2014-01-22 ENCOUNTER — Ambulatory Visit (HOSPITAL_BASED_OUTPATIENT_CLINIC_OR_DEPARTMENT_OTHER): Payer: BC Managed Care – PPO

## 2014-01-22 NOTE — Telephone Encounter (Signed)
Left message for call back Non identifiable  

## 2014-01-22 NOTE — Telephone Encounter (Signed)
CT is scheduled for today at 12:00pm. Patient has 2 insurances that both require pre-certification. Received approval from [facility], still waiting on [employer].

## 2014-01-22 NOTE — Telephone Encounter (Signed)
Please call the patient, a CT was ordered last night, I don't see the results. Needs a CT

## 2014-01-22 NOTE — Telephone Encounter (Signed)
Awaiting for CT procedure and results.

## 2014-01-22 NOTE — Telephone Encounter (Signed)
noted 

## 2014-01-25 ENCOUNTER — Encounter: Payer: Self-pay | Admitting: Internal Medicine

## 2014-03-04 ENCOUNTER — Encounter: Payer: Self-pay | Admitting: Nurse Practitioner

## 2014-03-04 ENCOUNTER — Encounter: Payer: Self-pay | Admitting: *Deleted

## 2014-03-04 ENCOUNTER — Ambulatory Visit (INDEPENDENT_AMBULATORY_CARE_PROVIDER_SITE_OTHER): Payer: BC Managed Care – PPO | Admitting: Nurse Practitioner

## 2014-03-04 VITALS — BP 105/69 | HR 82 | Temp 98.3°F | Ht 72.5 in | Wt 202.0 lb

## 2014-03-04 DIAGNOSIS — J019 Acute sinusitis, unspecified: Secondary | ICD-10-CM

## 2014-03-04 MED ORDER — AMOXICILLIN-POT CLAVULANATE 875-125 MG PO TABS: 1.0000 | ORAL_TABLET | Freq: Two times a day (BID) | ORAL | Status: DC

## 2014-03-04 NOTE — Progress Notes (Signed)
   Subjective:    Patient ID: Ronald Patrick, male    DOB: 1991/04/10, 23 y.o.   MRN: [id number]  Headache  This is a new problem. The current episode started today. The problem occurs constantly. The problem has been unchanged. The pain is located in the bilateral region. The pain does not radiate. The pain quality is not similar to prior headaches. The quality of the pain is described as aching. The pain is moderate. Associated symptoms include drainage, photophobia and sinus pressure. Pertinent negatives include no abnormal behavior, back pain, blurred vision, coughing, dizziness, ear pain, eye redness, fever, loss of balance, nausea, scalp tenderness, seizures, sore throat, tingling, visual change, vomiting or weakness. The symptoms are aggravated by bright light. He has tried acetaminophen (1000 mg) for the symptoms. The treatment provided no relief. (10 days "cold" symptoms w/nasal congestion)      Review of Systems  Constitutional: Negative for fever, chills, activity change, appetite change and fatigue.  HENT: Positive for congestion, postnasal drip and sinus pressure. Negative for ear pain and sore throat.   Eyes: Positive for photophobia. Negative for blurred vision and redness.  Respiratory: Negative for cough.   Gastrointestinal: Negative for nausea and vomiting.  Musculoskeletal: Negative for back pain.  Neurological: Positive for headaches. Negative for dizziness, tingling, seizures, weakness and loss of balance.  Hematological: Negative for adenopathy.       Objective:   Physical Exam  Vitals reviewed. Constitutional: He is oriented to person, place, and time. He appears well-developed and well-nourished. No distress.  HENT:  Head: Normocephalic and atraumatic.  Right Ear: External ear normal.  Left Ear: External ear normal.  Mouth/Throat: Oropharynx is clear and moist. No oropharyngeal exudate.  Purulent nasal d/c bilat, frequent sniffles during exam.   Eyes: Conjunctivae  are normal. Right eye exhibits no discharge. Left eye exhibits no discharge.  Neck: Normal range of motion. Neck supple. No thyromegaly present.  Cardiovascular: Normal rate, regular rhythm and normal heart sounds.   No murmur heard. Pulmonary/Chest: Effort normal and breath sounds normal. No respiratory distress. He has no wheezes. He has no rales.  Lymphadenopathy:    He has no cervical adenopathy.  Neurological: He is alert and oriented to person, place, and time.  Skin: Skin is warm and dry.  Psychiatric: He has a normal mood and affect. His behavior is normal. Thought content normal.  Asking for work note & to call employer. Declined to call employer, but will provide work note for today.          Assessment & Plan:  1. Sinusitis, acute 10 days nasal congestion. - amoxicillin-clavulanate (AUGMENTIN) 875-125 MG per tablet; Take 1 tablet by mouth 2 (two) times daily.  Dispense: 10 tablet; Refill: 0 See pt instructions.

## 2014-03-04 NOTE — Progress Notes (Signed)
Pre visit review using our clinic review tool, if applicable. No additional management support is needed unless otherwise documented below in the visit note. 

## 2014-03-04 NOTE — Patient Instructions (Signed)
Start antibiotic. Eat yogurt daily at lunch or afternoon to help prevent diarrhea that can be caused by antibiotic. Start daily sinus rinses (Neilmed Sinus rinse) for at least 5-7 days. You may use pseudoephedrine 30  To 60 mg twice daily for 4-6 days. Use ibuprophen 200 to 400 mg with food twice daily for headache. Please call for re-evaluation if you are not improving.   Sinusitis Sinusitis is redness, soreness, and swelling (inflammation) of the paranasal sinuses. Paranasal sinuses are air pockets within the bones of your face (beneath the eyes, the middle of the forehead, or above the eyes). In healthy paranasal sinuses, mucus is able to drain out, and air is able to circulate through them by way of your nose. However, when your paranasal sinuses are inflamed, mucus and air can become trapped. This can allow bacteria and other germs to grow and cause infection. Sinusitis can develop quickly and last only a short time (acute) or continue over a long period (chronic). Sinusitis that lasts for more than 12 weeks is considered chronic.  CAUSES  Causes of sinusitis include:  Allergies.  Structural abnormalities, such as displacement of the cartilage that separates your nostrils (deviated septum), which can decrease the air flow through your nose and sinuses and affect sinus drainage.  Functional abnormalities, such as when the small hairs (cilia) that line your sinuses and help remove mucus do not work properly or are not present. SYMPTOMS  Symptoms of acute and chronic sinusitis are the same. The primary symptoms are pain and pressure around the affected sinuses. Other symptoms include:  Upper toothache.  Earache.  Headache.  Bad breath.  Decreased sense of smell and taste.  A cough, which worsens when you are lying flat.  Fatigue.  Fever.  Thick drainage from your nose, which often is green and may contain pus (purulent).  Swelling and warmth over the affected sinuses. DIAGNOSIS    Your caregiver will perform a physical exam. During the exam, your caregiver may:  Look in your nose for signs of abnormal growths in your nostrils (nasal polyps).  Tap over the affected sinus to check for signs of infection.  View the inside of your sinuses (endoscopy) with a special imaging device with a light attached (endoscope), which is inserted into your sinuses. If your caregiver suspects that you have chronic sinusitis, one or more of the following tests may be recommended:  Allergy tests.  Nasal culture A sample of mucus is taken from your nose and sent to a lab and screened for bacteria.  Nasal cytology A sample of mucus is taken from your nose and examined by your caregiver to determine if your sinusitis is related to an allergy. TREATMENT  Most cases of acute sinusitis are related to a viral infection and will resolve on their own within 10 days. Sometimes medicines are prescribed to help relieve symptoms (pain medicine, decongestants, nasal steroid sprays, or saline sprays).  However, for sinusitis related to a bacterial infection, your caregiver will prescribe antibiotic medicines. These are medicines that will help kill the bacteria causing the infection.  Rarely, sinusitis is caused by a fungal infection. In theses cases, your caregiver will prescribe antifungal medicine. For some cases of chronic sinusitis, surgery is needed. Generally, these are cases in which sinusitis recurs more than 3 times per year, despite other treatments. HOME CARE INSTRUCTIONS   Drink plenty of water. Water helps thin the mucus so your sinuses can drain more easily.  Use a humidifier.  Inhale steam  3 to 4 times a day (for example, sit in the bathroom with the shower running).  Apply a warm, moist washcloth to your face 3 to 4 times a day, or as directed by your caregiver.  Use saline nasal sprays to help moisten and clean your sinuses.  Take over-the-counter or prescription medicines for  pain, discomfort, or fever only as directed by your caregiver. SEEK IMMEDIATE MEDICAL CARE IF:  You have increasing pain or severe headaches.  You have nausea, vomiting, or drowsiness.  You have swelling around your face.  You have vision problems.  You have a stiff neck.  You have difficulty breathing. MAKE SURE YOU:   Understand these instructions.  Will watch your condition.  Will get help right away if you are not doing well or get worse. Document Released: 12/11/2005 Document Revised: 03/04/2012 Document Reviewed: 12/26/2011 [facility] Patient Information 2014 [place], [place].

## 2014-06-24 ENCOUNTER — Encounter: Payer: Self-pay | Admitting: Internal Medicine

## 2014-06-24 ENCOUNTER — Ambulatory Visit (INDEPENDENT_AMBULATORY_CARE_PROVIDER_SITE_OTHER): Payer: BC Managed Care – PPO | Admitting: Internal Medicine

## 2014-06-24 VITALS — BP 104/70 | HR 61 | Temp 98.0°F | Wt 200.0 lb

## 2014-06-24 DIAGNOSIS — K529 Noninfective gastroenteritis and colitis, unspecified: Secondary | ICD-10-CM

## 2014-06-24 DIAGNOSIS — K5289 Other specified noninfective gastroenteritis and colitis: Secondary | ICD-10-CM

## 2014-06-24 MED ORDER — ONDANSETRON HCL 4 MG PO TABS: 4.0000 mg | ORAL_TABLET | Freq: Three times a day (TID) | ORAL | Status: DC | PRN

## 2014-06-24 NOTE — Progress Notes (Signed)
   Subjective:    Patient ID: Dionne MiloAjee Vandyke, male    DOB: [birth year], 23 y.o.   MRN: [id number]  DOS:  06/24/2014 Type of  Visit: acute History:  Symptoms started yesterday, developed a bubbly  feeling in the stomach followup by 3 bowel movements, last one was watery. Also had  nausea and mild stomach pain. Today he had one loose bowel movement, continue with nausea, vomited once this morning. No further abdominal pain. Apparently one coworker is sick with similar symptoms. Did eat "undercook" meat two days ago (per patient)   ROS No fever chills, appetite is okay but he is afraid to eat. No blood in the stools or mucus  Past Medical History  Diagnosis Date  . Chronic tonsillitis   . Allergic rhinitis     Past Surgical History  Procedure Laterality Date  . No past surgeries      History   Social History  . Marital Status: Single    Spouse Name: N/A    Number of Children: 0  . Years of Education: N/A   Occupational History  .  [employer]   Social History Main Topics  . Smoking status: Never Smoker   . Smokeless tobacco: Never Used  . Alcohol Use: Yes     Comment: rarely  . Drug Use: No  . Sexual Activity: Not on file   Other Topics Concern  . Not on file   Social History Narrative   Finished high school 2012 .            Medication List       This list is accurate as of: 06/24/14  1:12 PM.  Always use your most recent med list.               ondansetron 4 MG tablet  Commonly known as:  ZOFRAN  Take 1 tablet (4 mg total) by mouth every 8 (eight) hours as needed for nausea or vomiting.           Objective:   Physical Exam BP 104/70  Pulse 61  Temp(Src) 98 F (36.7 C)  Wt 200 lb (90.719 kg)  SpO2 97% General -- alert, well-developed, NAD.   HEENT-- Not pale or jaundice Lungs -- normal respiratory effort, no intercostal retractions, no accessory muscle use, and normal breath sounds.  Heart-- normal rate, regular rhythm, no murmur.    Abdomen--  Not distended, increased bowel sounds, soft, nontender.  Extremities-- no pretibial edema bilaterally  Neurologic--  alert & oriented X3. Speech normal, gait appropriate for age, strength symmetric and appropriate for age.  Psych-- Cognition and judgment appear intact. Cooperative with normal attention span and concentration. No anxious or depressed appearing.        Assessment & Plan:    Gastroenteritis, Symptoms most likely due to gastroenteritis. I did see a patient w/ similar sx yesterday----> viral? Food related ? Abdominal exam is benign, recommend conservative treatment, see instructions

## 2014-06-24 NOTE — Patient Instructions (Signed)
Rest Drink plenty of clear fluids such as water, 7-Up, Gatorade Follow a bland diet such as a light chicken soup with rice Take Zofran as needed for nausea Pepto-Bismol as needed for diarrhea Call if not improving in the next few days Go to the ER if severe symptoms, fever, chills, unable to keep fluids down

## 2014-06-24 NOTE — Progress Notes (Signed)
Pre visit review using our clinic review tool, if applicable. No additional management support is needed unless otherwise documented below in the visit note. 

## 2015-03-26 ENCOUNTER — Encounter (HOSPITAL_BASED_OUTPATIENT_CLINIC_OR_DEPARTMENT_OTHER): Payer: Self-pay | Admitting: *Deleted

## 2015-03-26 ENCOUNTER — Emergency Department (HOSPITAL_BASED_OUTPATIENT_CLINIC_OR_DEPARTMENT_OTHER): Payer: Self-pay

## 2015-03-26 ENCOUNTER — Emergency Department (HOSPITAL_BASED_OUTPATIENT_CLINIC_OR_DEPARTMENT_OTHER)
Admission: EM | Admit: 2015-03-26 | Discharge: 2015-03-26 | Disposition: A | Discharge status: Home / Self Care | Payer: Self-pay | Attending: Emergency Medicine | Admitting: Emergency Medicine

## 2015-03-26 DIAGNOSIS — R059 Cough, unspecified: Secondary | ICD-10-CM

## 2015-03-26 DIAGNOSIS — Z720 Tobacco use: Secondary | ICD-10-CM | POA: Insufficient documentation

## 2015-03-26 DIAGNOSIS — B349 Viral infection, unspecified: Secondary | ICD-10-CM | POA: Insufficient documentation

## 2015-03-26 DIAGNOSIS — R05 Cough: Secondary | ICD-10-CM

## 2015-03-26 DIAGNOSIS — Z8709 Personal history of other diseases of the respiratory system: Secondary | ICD-10-CM | POA: Insufficient documentation

## 2015-03-26 MED ORDER — BENZONATATE 100 MG PO CAPS: 100.0000 mg | ORAL_CAPSULE | Freq: Three times a day (TID) | ORAL | Status: DC

## 2015-03-26 MED ORDER — IBUPROFEN 800 MG PO TABS: 800.0000 mg | ORAL_TABLET | Freq: Three times a day (TID) | ORAL | Status: DC

## 2015-03-26 NOTE — ED Notes (Signed)
Pt c/o 3 days of chills, HA, and fatigue.

## 2015-03-26 NOTE — Discharge Instructions (Signed)

## 2015-03-26 NOTE — ED Notes (Signed)
Pt verbalizes understanding of d/c instructions.

## 2015-03-26 NOTE — ED Provider Notes (Signed)
CSN: [id number]     Arrival date & time 03/26/15  1451 History   First MD Initiated Contact with Patient 03/26/15 1504     Chief Complaint  Patient presents with  . Headache     (Consider location/radiation/quality/duration/timing/severity/associated sxs/prior Treatment) Patient is a 24 y.o. male presenting with headaches. The history is provided by the patient. No language interpreter was used.  Headache Pain location:  Generalized Quality:  Sharp Radiates to:  Does not radiate Severity currently:  5/10 Severity at highest:  5/10 Onset quality:  Gradual Duration:  3 days Timing:  Constant Progression:  Worsening Chronicity:  New Similar to prior headaches: yes   Context: not activity   Relieved by:  Nothing Worsened by:  Nothing Ineffective treatments:  None tried Associated symptoms: congestion, cough, fatigue, fever and URI   Associated symptoms: no vomiting   Risk factors: no anger     Past Medical History  Diagnosis Date  . Chronic tonsillitis   . Allergic rhinitis    Past Surgical History  Procedure Laterality Date  . No past surgeries     Family History  Problem Relation Age of Onset  . CAD Neg Hx   . Diabetes Other     GM, late onset  . Colon cancer Neg Hx   . Prostate cancer Neg Hx    History  Substance Use Topics  . Smoking status: Current Some Day Smoker  . Smokeless tobacco: Never Used  . Alcohol Use: Yes     Comment: rarely    Review of Systems  Constitutional: Positive for fever and fatigue.  HENT: Positive for congestion.   Respiratory: Positive for cough.   Gastrointestinal: Negative for vomiting.  Neurological: Positive for headaches.  All other systems reviewed and are negative.     Allergies  Review of patient's allergies indicates no known allergies.  Home Medications   Prior to Admission medications   Medication Sig Start Date End Date Taking? Authorizing Provider  ondansetron (ZOFRAN) 4 MG tablet Take 1 tablet (4 mg  total) by mouth every 8 (eight) hours as needed for nausea or vomiting. 06/24/14   Wanda Plump, MD   BP 141/84 mmHg  Pulse 84  Temp(Src) 98.4 F (36.9 C) (Oral)  Resp 18  Ht  (1.854 m)  Wt 220 lb (99.791 kg)  BMI 29.03 kg/m2  SpO2 99% Physical Exam  Constitutional: He is oriented to person, place, and time. He appears well-developed and well-nourished.  HENT:  Head: Normocephalic and atraumatic.  Right Ear: External ear normal.  Nose: Nose normal.  Mouth/Throat: Oropharynx is clear and moist.  Eyes: EOM are normal. Pupils are equal, round, and reactive to light.  Neck: Normal range of motion.  Cardiovascular: Normal rate and normal heart sounds.   Pulmonary/Chest: Effort normal.  Abdominal: He exhibits no distension.  Musculoskeletal: Normal range of motion.  Neurological: He is alert and oriented to person, place, and time.  Skin: Skin is warm.  Psychiatric: He has a normal mood and affect.  Nursing note and vitals reviewed.   ED Course  Procedures (including critical care time) Labs Review Labs Reviewed - No data to display  Imaging Review Dg Chest 2 View  03/26/2015   CLINICAL DATA:  Cough  EXAM: CHEST  2 VIEW  COMPARISON:  04/15/2013  FINDINGS: The heart size and mediastinal contours are within normal limits. Both lungs are clear. The visualized skeletal structures are unremarkable.  IMPRESSION: No active cardiopulmonary disease.   Electronically Signed  By: Alcide CleverMark  Lukens M.D.   On: 03/26/2015 16:07     EKG Interpretation None      MDM  Chest xray is normal   Pt given ibuprofen and tessalon perles.  Pt counseled on viral illness, possible influenza.   Final diagnoses:  Cough  Viral illness        Elson AreasLeslie K Vincie Linn, PA-C 03/26/15 1658  Jerelyn ScottMartha Linker, MD 03/26/15 [phone]

## 2015-03-26 NOTE — ED Notes (Signed)
Patient transported to X-ray 

## 2015-04-17 ENCOUNTER — Emergency Department (HOSPITAL_BASED_OUTPATIENT_CLINIC_OR_DEPARTMENT_OTHER)
Admission: EM | Admit: 2015-04-17 | Discharge: 2015-04-17 | Disposition: A | Discharge status: Home / Self Care | Payer: Self-pay | Attending: Emergency Medicine | Admitting: Emergency Medicine

## 2015-04-17 ENCOUNTER — Encounter (HOSPITAL_BASED_OUTPATIENT_CLINIC_OR_DEPARTMENT_OTHER): Payer: Self-pay

## 2015-04-17 ENCOUNTER — Emergency Department (HOSPITAL_BASED_OUTPATIENT_CLINIC_OR_DEPARTMENT_OTHER): Payer: Self-pay

## 2015-04-17 DIAGNOSIS — R000 Tachycardia, unspecified: Secondary | ICD-10-CM | POA: Insufficient documentation

## 2015-04-17 DIAGNOSIS — Z79899 Other long term (current) drug therapy: Secondary | ICD-10-CM | POA: Insufficient documentation

## 2015-04-17 DIAGNOSIS — B349 Viral infection, unspecified: Secondary | ICD-10-CM | POA: Insufficient documentation

## 2015-04-17 DIAGNOSIS — J069 Acute upper respiratory infection, unspecified: Secondary | ICD-10-CM | POA: Insufficient documentation

## 2015-04-17 DIAGNOSIS — Z720 Tobacco use: Secondary | ICD-10-CM | POA: Insufficient documentation

## 2015-04-17 LAB — CBC WITH DIFFERENTIAL/PLATELET
BASOS ABS: 0 10*3/uL (ref 0.0–0.1)
Basophils Relative: 0 % (ref 0–1)
EOS PCT: 1 % (ref 0–5)
Eosinophils Absolute: 0.2 10*3/uL (ref 0.0–0.7)
HEMATOCRIT: 42.7 % (ref 39.0–52.0)
Hemoglobin: 14.6 g/dL (ref 13.0–17.0)
LYMPHS ABS: 2 10*3/uL (ref 0.7–4.0)
LYMPHS PCT: 13 % (ref 12–46)
MCH: 31.9 pg (ref 26.0–34.0)
MCHC: 34.2 g/dL (ref 30.0–36.0)
MCV: 93.4 fL (ref 78.0–100.0)
Monocytes Absolute: 1.3 10*3/uL — ABNORMAL HIGH (ref 0.1–1.0)
Monocytes Relative: 8 % (ref 3–12)
NEUTROS ABS: 12.3 10*3/uL — AB (ref 1.7–7.7)
Neutrophils Relative %: 78 % — ABNORMAL HIGH (ref 43–77)
Platelets: 236 10*3/uL (ref 150–400)
RBC: 4.57 MIL/uL (ref 4.22–5.81)
RDW: 12.3 % (ref 11.5–15.5)
WBC: 15.9 10*3/uL — AB (ref 4.0–10.5)

## 2015-04-17 LAB — URINALYSIS, ROUTINE W REFLEX MICROSCOPIC
Bilirubin Urine: NEGATIVE
GLUCOSE, UA: NEGATIVE mg/dL
Hgb urine dipstick: NEGATIVE
Ketones, ur: NEGATIVE mg/dL
Leukocytes, UA: NEGATIVE
Nitrite: NEGATIVE
PROTEIN: NEGATIVE mg/dL
Specific Gravity, Urine: 1.013 (ref 1.005–1.030)
UROBILINOGEN UA: 2 mg/dL — AB (ref 0.0–1.0)
pH: 6.5 (ref 5.0–8.0)

## 2015-04-17 LAB — LIPASE, BLOOD: Lipase: 22 U/L (ref 11–59)

## 2015-04-17 LAB — COMPREHENSIVE METABOLIC PANEL
ALT: 25 U/L (ref 0–53)
AST: 28 U/L (ref 0–37)
Albumin: 4.5 g/dL (ref 3.5–5.2)
Alkaline Phosphatase: 42 U/L (ref 39–117)
Anion gap: 7 (ref 5–15)
BUN: 12 mg/dL (ref 6–23)
CO2: 26 mmol/L (ref 19–32)
Calcium: 9.1 mg/dL (ref 8.4–10.5)
Chloride: 102 mmol/L (ref 96–112)
Creatinine, Ser: 1.29 mg/dL (ref 0.50–1.35)
GFR calc Af Amer: >90 mL/min (ref >90)
GFR, EST NON AFRICAN AMERICAN: 78 mL/min — AB (ref >90)
GLUCOSE: 111 mg/dL — AB (ref 70–99)
POTASSIUM: 3.8 mmol/L (ref 3.5–5.1)
Sodium: 135 mmol/L (ref 135–145)
TOTAL PROTEIN: 7.9 g/dL (ref 6.0–8.3)
Total Bilirubin: 0.7 mg/dL (ref 0.3–1.2)

## 2015-04-17 LAB — RAPID STREP SCREEN (MED CTR MEBANE ONLY): Streptococcus, Group A Screen (Direct): NEGATIVE

## 2015-04-17 MED ORDER — SODIUM CHLORIDE 0.9 % IV BOLUS (SEPSIS): 1000.0000 mL | Freq: Once | INTRAVENOUS | Status: DC

## 2015-04-17 MED ORDER — ACETAMINOPHEN 325 MG PO TABS: 325.0000 mg | ORAL_TABLET | Freq: Four times a day (QID) | ORAL | Status: DC | PRN

## 2015-04-17 MED ORDER — ACETAMINOPHEN 325 MG PO TABS
ORAL_TABLET | ORAL | Status: DC
Start: 2015-04-17 — End: 2015-04-18
  Filled 2015-04-17: qty 2

## 2015-04-17 MED ORDER — ACETAMINOPHEN 325 MG PO TABS
650.0000 mg | ORAL_TABLET | Freq: Once | ORAL | Status: AC
  Administered 2015-04-17: 650 mg via ORAL

## 2015-04-17 MED ORDER — IBUPROFEN 600 MG PO TABS: 600.0000 mg | ORAL_TABLET | Freq: Four times a day (QID) | ORAL | Status: DC | PRN

## 2015-04-17 NOTE — ED Provider Notes (Signed)
CSN: [id number]     Arrival date & time 04/17/15  1858 History   First MD Initiated Contact with Patient 04/17/15 1917     Chief Complaint  Patient presents with  . Cough     (Consider location/radiation/quality/duration/timing/severity/associated sxs/prior Treatment) The history is provided by the patient. No language interpreter was used.  Ronald Patrick is a 24 year old male with past medical history of tonsillitis, allergic rhinitis presenting to the ED with nasal congestion, cough, sore throat, chills that started yesterday evening. Patient reports that the cough is dry. Stated that he's been having sore throat worse with cough. Stated that he's been feeling feverish, and experiencing chills and wearing sweatshirts throughout the day. Stated he's been using Robitussin and nasal spray over-the-counter without relief. Stated that when he arrived to the ED he was told that he had a fever of 100.70F-Tylenol administered. Reported that he did not get his flu vaccine. Stated that he had posttussive emesis after a coughing fit. Denied neck pain, neck stiffness, blurred vision, sudden loss of vision, chest pain, shortness of breath, difficulty breathing, throat closing sensation, ear pain/complaints, eye pain/complaints, numbness, tingling, abdominal pain, nausea, vomiting, diarrhea, hemoptysis, urinary symptoms, issues with passing flatulence, melena, hematochezia, sick contacts, travel. Denied flu vaccine. PCP Dr. Drue Novel  Past Medical History  Diagnosis Date  . Chronic tonsillitis   . Allergic rhinitis    Past Surgical History  Procedure Laterality Date  . No past surgeries     Family History  Problem Relation Age of Onset  . CAD Neg Hx   . Diabetes Other     GM, late onset  . Colon cancer Neg Hx   . Prostate cancer Neg Hx    History  Substance Use Topics  . Smoking status: Light Tobacco Smoker  . Smokeless tobacco: Never Used  . Alcohol Use: Yes     Comment: occ    Review of  Systems  Constitutional: Positive for fever and chills.  HENT: Positive for congestion and sore throat. Negative for trouble swallowing.   Eyes: Negative for visual disturbance.  Respiratory: Positive for cough. Negative for chest tightness and shortness of breath.   Cardiovascular: Negative for chest pain.  Gastrointestinal: Positive for nausea and vomiting. Negative for abdominal pain, diarrhea, constipation, blood in stool and anal bleeding.  Genitourinary: Negative for dysuria, hematuria and decreased urine volume.  Musculoskeletal: Positive for myalgias. Negative for back pain, neck pain and neck stiffness.  Neurological: Negative for dizziness, weakness, numbness and headaches.      Allergies  Review of patient's allergies indicates no known allergies.  Home Medications   Prior to Admission medications   Medication Sig Start Date End Date Taking? Authorizing Provider  acetaminophen (TYLENOL) 325 MG tablet Take 1 tablet (325 mg total) by mouth every 6 (six) hours as needed for fever. 04/17/15   Jayro Mcmath, PA-C  benzonatate (TESSALON) 100 MG capsule Take 1 capsule (100 mg total) by mouth every 8 (eight) hours. 03/26/15   Elson Areas, PA-C  ibuprofen (ADVIL,MOTRIN) 600 MG tablet Take 1 tablet (600 mg total) by mouth every 6 (six) hours as needed for fever. 04/17/15   Weylin Plagge, PA-C  ondansetron (ZOFRAN) 4 MG tablet Take 1 tablet (4 mg total) by mouth every 8 (eight) hours as needed for nausea or vomiting. 06/24/14   Wanda Plump, MD   BP 130/77 mmHg  Pulse 88  Temp(Src) 99.4 F (37.4 C) (Oral)  Resp 18  Ht  (1.854 m)  Wt 215 lb (97.523 kg)  BMI 28.37 kg/m2  SpO2 98% Physical Exam  Constitutional: He is oriented to person, place, and time. He appears well-developed and well-nourished. No distress.  HENT:  Head: Normocephalic and atraumatic.  Right Ear: Hearing, tympanic membrane and external ear normal. No mastoid tenderness.  Left Ear: Hearing, tympanic  membrane, external ear and ear canal normal. No mastoid tenderness.  Mouth/Throat: Oropharynx is clear and moist. No oropharyngeal exudate.  Negative swelling, erythema, inflammation, lesions, sores, deformities identified to the posterior oropharynx. Negative ulcerations, pustules or lesions noted. Uvula midline with symmetrical elevation. Negative uvula swelling. Negative trismus. Negative sublingual lesions. Negative petechiae identified to the soft palate.  Eyes: Conjunctivae and EOM are normal. Pupils are equal, round, and reactive to light. Right eye exhibits no discharge. Left eye exhibits no discharge.  Neck: Normal range of motion. Neck supple. No tracheal deviation present.  Negative neck stiffness Negative nuchal rigidity Negative cervical lymphadenopathy Negative meningeal signs  Cardiovascular: Regular rhythm and normal heart sounds.  Tachycardia present.  Exam reveals no friction rub.   No murmur heard. Pulses:      Radial pulses are 2+ on the right side, and 2+ on the left side.       Dorsalis pedis pulses are 2+ on the right side, and 2+ on the left side.  Pulmonary/Chest: Effort normal and breath sounds normal. No respiratory distress. He has no wheezes. He has no rales. He exhibits no tenderness.  Patient is able to speak in full sentences without difficulty Negative use of accessory muscles Negative stridor  Abdominal: Soft. Bowel sounds are normal. He exhibits no distension. There is no tenderness. There is no rebound and no guarding.  Musculoskeletal: Normal range of motion.  Lymphadenopathy:    He has no cervical adenopathy.  Neurological: He is alert and oriented to person, place, and time. No cranial nerve deficit. He exhibits normal muscle tone. Coordination normal.  Skin: Skin is warm and dry. No rash noted. He is not diaphoretic. No erythema.  Psychiatric: He has a normal mood and affect. His behavior is normal. Thought content normal.  Nursing note and vitals  reviewed.   ED Course  Procedures (including critical care time)  Results for orders placed or performed during the hospital encounter of 04/17/15  Rapid strep screen  Result Value Ref Range   Streptococcus, Group A Screen (Direct) NEGATIVE NEGATIVE  CBC with Differential/Platelet  Result Value Ref Range   WBC 15.9 (H) 4.0 - 10.5 K/uL   RBC 4.57 4.22 - 5.81 MIL/uL   Hemoglobin 14.6 13.0 - 17.0 g/dL   HCT 04.542.7 40.939.0 - 81.152.0 %   MCV 93.4 78.0 - 100.0 fL   MCH 31.9 26.0 - 34.0 pg   MCHC 34.2 30.0 - 36.0 g/dL   RDW 91.412.3 78.211.5 - 95.615.5 %   Platelets 236 150 - 400 K/uL   Neutrophils Relative % 78 (H) 43 - 77 %   Neutro Abs 12.3 (H) 1.7 - 7.7 K/uL   Lymphocytes Relative 13 12 - 46 %   Lymphs Abs 2.0 0.7 - 4.0 K/uL   Monocytes Relative 8 3 - 12 %   Monocytes Absolute 1.3 (H) 0.1 - 1.0 K/uL   Eosinophils Relative 1 0 - 5 %   Eosinophils Absolute 0.2 0.0 - 0.7 K/uL   Basophils Relative 0 0 - 1 %   Basophils Absolute 0.0 0.0 - 0.1 K/uL  Comprehensive metabolic panel  Result Value Ref Range   Sodium 135  135 - 145 mmol/L   Potassium 3.8 3.5 - 5.1 mmol/L   Chloride 102 96 - 112 mmol/L   CO2 26 19 - 32 mmol/L   Glucose, Bld 111 (H) 70 - 99 mg/dL   BUN 12 6 - 23 mg/dL   Creatinine, Ser 9.60 0.50 - 1.35 mg/dL   Calcium 9.1 8.4 - 45.4 mg/dL   Total Protein 7.9 6.0 - 8.3 g/dL   Albumin 4.5 3.5 - 5.2 g/dL   AST 28 0 - 37 U/L   ALT 25 0 - 53 U/L   Alkaline Phosphatase 42 39 - 117 U/L   Total Bilirubin 0.7 0.3 - 1.2 mg/dL   GFR calc non Af Amer 78 (L) >90 mL/min   GFR calc Af Amer >90 >90 mL/min   Anion gap 7 5 - 15  Lipase, blood  Result Value Ref Range   Lipase 22 11 - 59 U/L  Urinalysis, Routine w reflex microscopic  Result Value Ref Range   Color, Urine YELLOW YELLOW   APPearance CLEAR CLEAR   Specific Gravity, Urine 1.013 1.005 - 1.030   pH 6.5 5.0 - 8.0   Glucose, UA NEGATIVE NEGATIVE mg/dL   Hgb urine dipstick NEGATIVE NEGATIVE   Bilirubin Urine NEGATIVE NEGATIVE   Ketones,  ur NEGATIVE NEGATIVE mg/dL   Protein, ur NEGATIVE NEGATIVE mg/dL   Urobilinogen, UA 2.0 (H) 0.0 - 1.0 mg/dL   Nitrite NEGATIVE NEGATIVE   Leukocytes, UA NEGATIVE NEGATIVE    Labs Review Labs Reviewed  CBC WITH DIFFERENTIAL/PLATELET - Abnormal; Notable for the following:    WBC 15.9 (*)    Neutrophils Relative % 78 (*)    Neutro Abs 12.3 (*)    Monocytes Absolute 1.3 (*)    All other components within normal limits  COMPREHENSIVE METABOLIC PANEL - Abnormal; Notable for the following:    Glucose, Bld 111 (*)    GFR calc non Af Amer 78 (*)    All other components within normal limits  URINALYSIS, ROUTINE W REFLEX MICROSCOPIC - Abnormal; Notable for the following:    Urobilinogen, UA 2.0 (*)    All other components within normal limits  RAPID STREP SCREEN  CULTURE, GROUP A STREP  LIPASE, BLOOD    Imaging Review Dg Chest 2 View  04/17/2015   CLINICAL DATA:  Cough, congestion  EXAM: CHEST  2 VIEW  COMPARISON:  03/26/2015  FINDINGS: The heart size and mediastinal contours are within normal limits. Both lungs are clear. The visualized skeletal structures are unremarkable.  IMPRESSION: No active cardiopulmonary disease.   Electronically Signed   By: Christiana Pellant M.D.   On: 04/17/2015 20:27     EKG Interpretation None      MDM   Final diagnoses:  Upper respiratory infection  Viral illness    Medications  acetaminophen (TYLENOL) 325 MG tablet (not administered)  sodium chloride 0.9 % bolus 1,000 mL (1,000 mLs Intravenous Not Given 04/17/15 2140)  acetaminophen (TYLENOL) tablet 650 mg (650 mg Oral Given 04/17/15 1910)   Filed Vitals:   04/17/15 1905 04/17/15 2137  BP: 130/88 130/77  Pulse: 101 88  Temp: 100.5 F (38.1 C) 99.4 F (37.4 C)  TempSrc: Oral Oral  Resp: 18 18  Height: 6\' 1"  (1.854 m)   Weight: 215 lb (97.523 kg)   SpO2: 98% 98%   CBC noted elevated white blood cell count of 15.9. Hemoglobin 14.6, hematocrit 42.7. CMP unremarkable. Lipase negative  elevation. Rapid strep test negative. Urinalysis negative for hemoglobin,  nitrites, leukocytes negative findings of infection. Chest x-ray no active cardiopulmonary disease noted. Negative findings of streptococcal pharyngitis. Doubt peritonsillar abscess. Doubt retropharyngeal abscess. Doubt Ludwig's angina. Doubt meningitis. Negative findings of pneumonia. Negative signs of end organ damage. Doubt otitis media and externa. Doubt acute abdominal processes-abdominal exam benign-soft, nontender. Suspicion to be viral illness. Cannot rule out possible flu. Patient offered IV fluids in ED setting-patient refused. Tylenol given in ED setting with decrease in temperature from 100.41F to 99.68F. Patient stable, afebrile. Patient not septic appearing. Negative findings of respiratory distress. Patient tolerated fluids by mouth in ED without difficulty-negative episodes of emesis while in ED setting. Discharged patient. Discussed with patient to rest and stay hydrated. Discussed with patient to continue to monitor and treat for fever. Referred patient to PCP. Discussed with patient to closely monitor symptoms and if symptoms are to worsen or change to report back to the ED - strict return instructions given.  Patient agreed to plan of care, understood, all questions answered.    Raymon Mutton, PA-C 04/17/15 2241  Gerhard Munch, MD 04/18/15 [phone]

## 2015-04-17 NOTE — ED Notes (Signed)
Urine specimen cup given

## 2015-04-17 NOTE — ED Notes (Signed)
Pt with cough, congestion, subjective fever and vomiting x 3 days.

## 2015-04-17 NOTE — Discharge Instructions (Signed)
Please call your doctor for a followup appointment within 24-48 hours. When you talk to your doctor please let them know that you were seen in the emergency department and have them acquire all of your records so that they can discuss the findings with you and formulate a treatment plan to fully care for your new and ongoing problems. Please follow-up with her primary care provider Please rest and stay hydrated-please drink plenty of fluids Please alternate between Tylenol and ibuprofen every 6 hours (Tylenol first then 6 hours later ibuprofen than 6 hours later Tylenol than 6 hours later ibuprofen, etc.) Please continue to monitor symptoms closely and if symptoms are to worsen or change (fever greater than 101, chills, sweating, nausea, vomiting, chest pain, shortness of breathe, difficulty breathing, weakness, numbness, tingling, worsening or changes to pain pattern, headache, blurred vision, sudden loss of vision, inability to control fever, inability keep food or fluids down, diarrhea, blood in the stools, black tarry stools, coughing up blood) please report back to the Emergency Department immediately.   Viral Infections A virus is a type of germ. Viruses can cause:  Minor sore throats.  Aches and pains.  Headaches.  Runny nose.  Rashes.  Watery eyes.  Tiredness.  Coughs.  Loss of appetite.  Feeling sick to your stomach (nausea).  Throwing up (vomiting).  Watery poop (diarrhea). HOME CARE   Only take medicines as told by your doctor.  Drink enough water and fluids to keep your pee (urine) clear or pale yellow. Sports drinks are a good choice.  Get plenty of rest and eat healthy. Soups and broths with crackers or rice are fine. GET HELP RIGHT AWAY IF:   You have a very bad headache.  You have shortness of breath.  You have chest pain or neck pain.  You have an unusual rash.  You cannot stop throwing up.  You have watery poop that does not stop.  You cannot  keep fluids down.  You or your child has a temperature by mouth above 102 F (38.9 C), not controlled by medicine.  Your baby is older than 3 months with a rectal temperature of 102 F (38.9 C) or higher.  Your baby is 573 months old or younger with a rectal temperature of 100.4 F (38 C) or higher. MAKE SURE YOU:   Understand these instructions.  Will watch this condition.  Will get help right away if you are not doing well or get worse. Document Released: 11/23/2008 Document Revised: 03/04/2012 Document Reviewed: 04/18/2011 [facility] Patient Information 2015 [employer] [place]. This information is not intended to replace advice given to you by your health care provider. Make sure you discuss any questions you have with your health care provider.

## 2015-04-20 LAB — CULTURE, GROUP A STREP

## 2016-03-01 IMAGING — CR DG CHEST 2V
2 series · 2 of 2 positions shown · non-contrast
Comparison: 04/15/2013

CLINICAL DATA: Cough

EXAM:
CHEST  2 VIEW

[w chest pa]
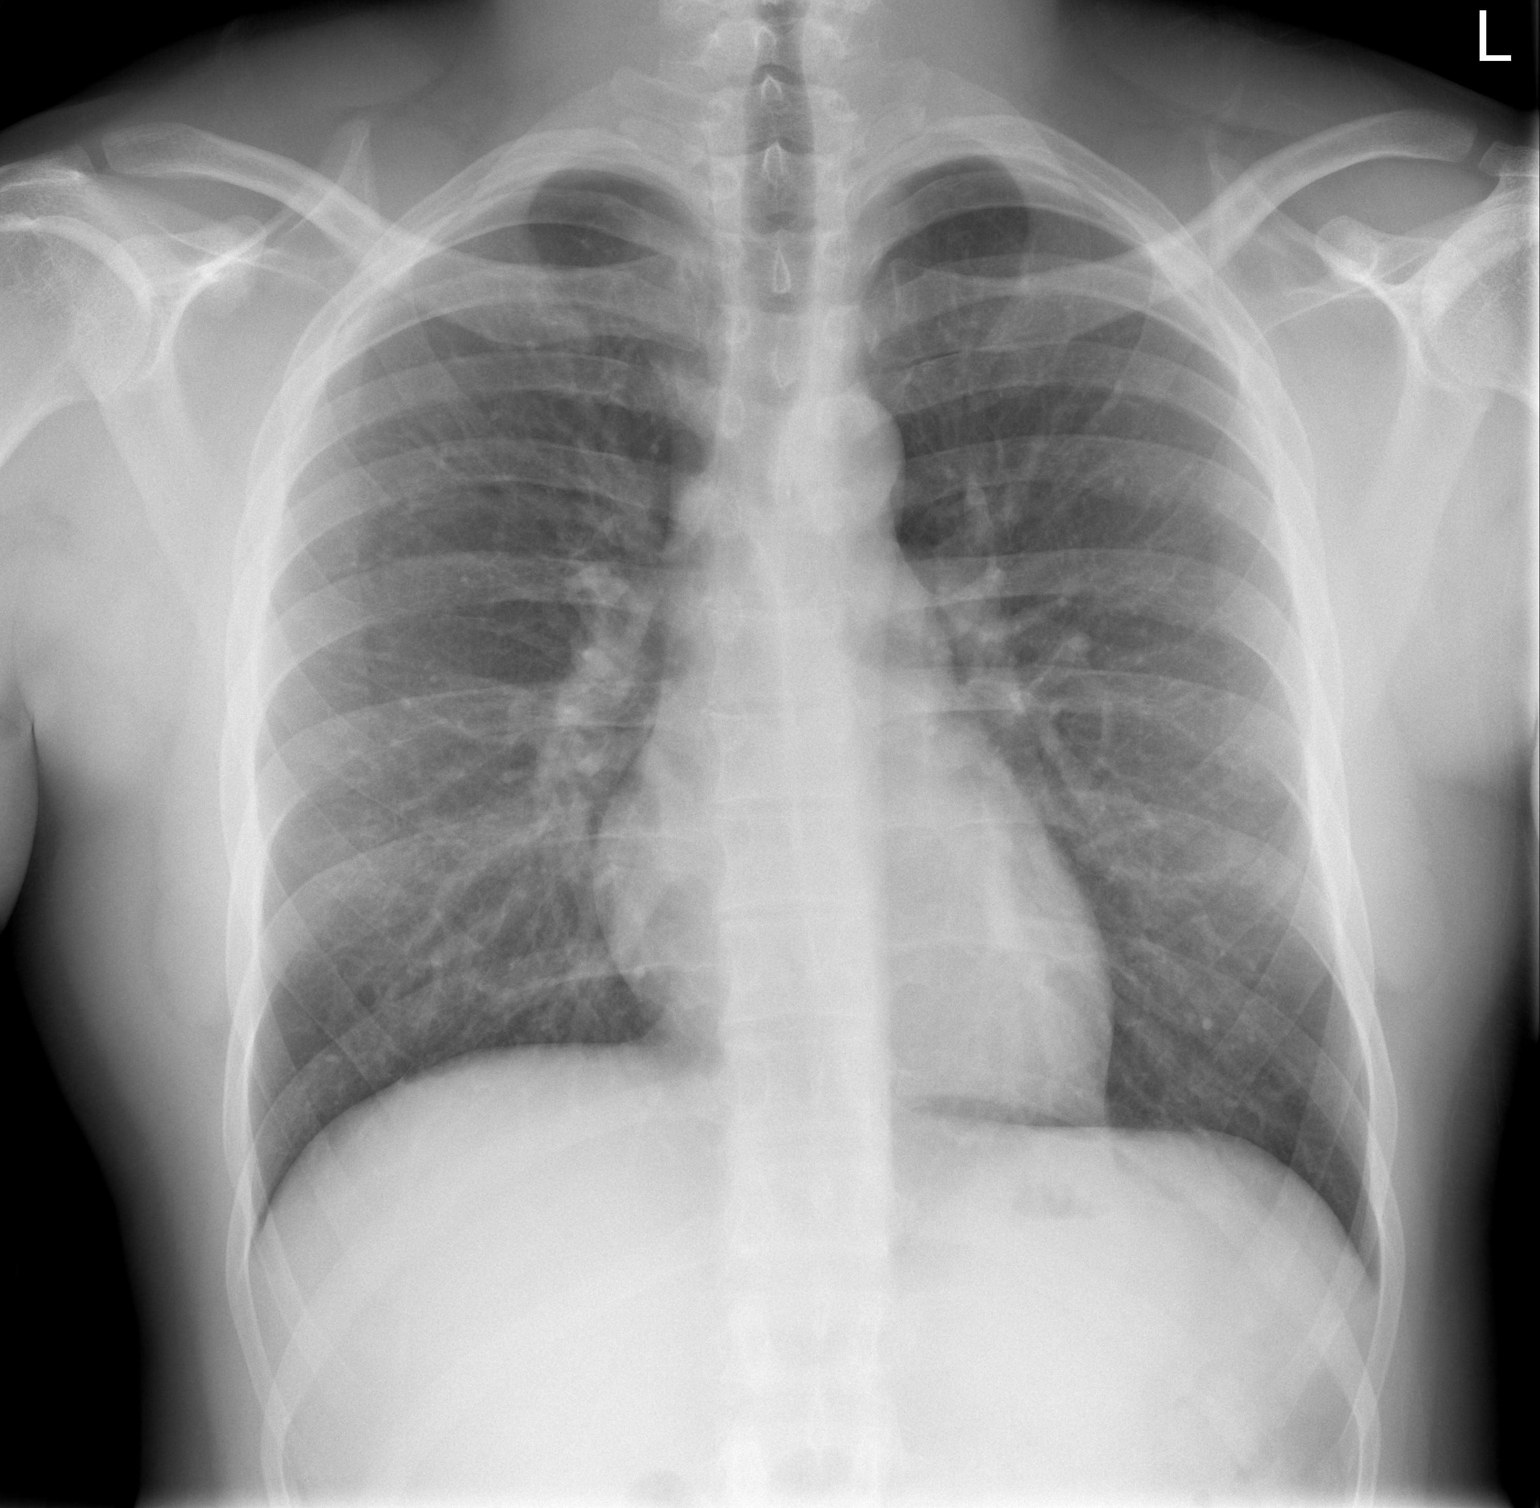

[w chest lat]
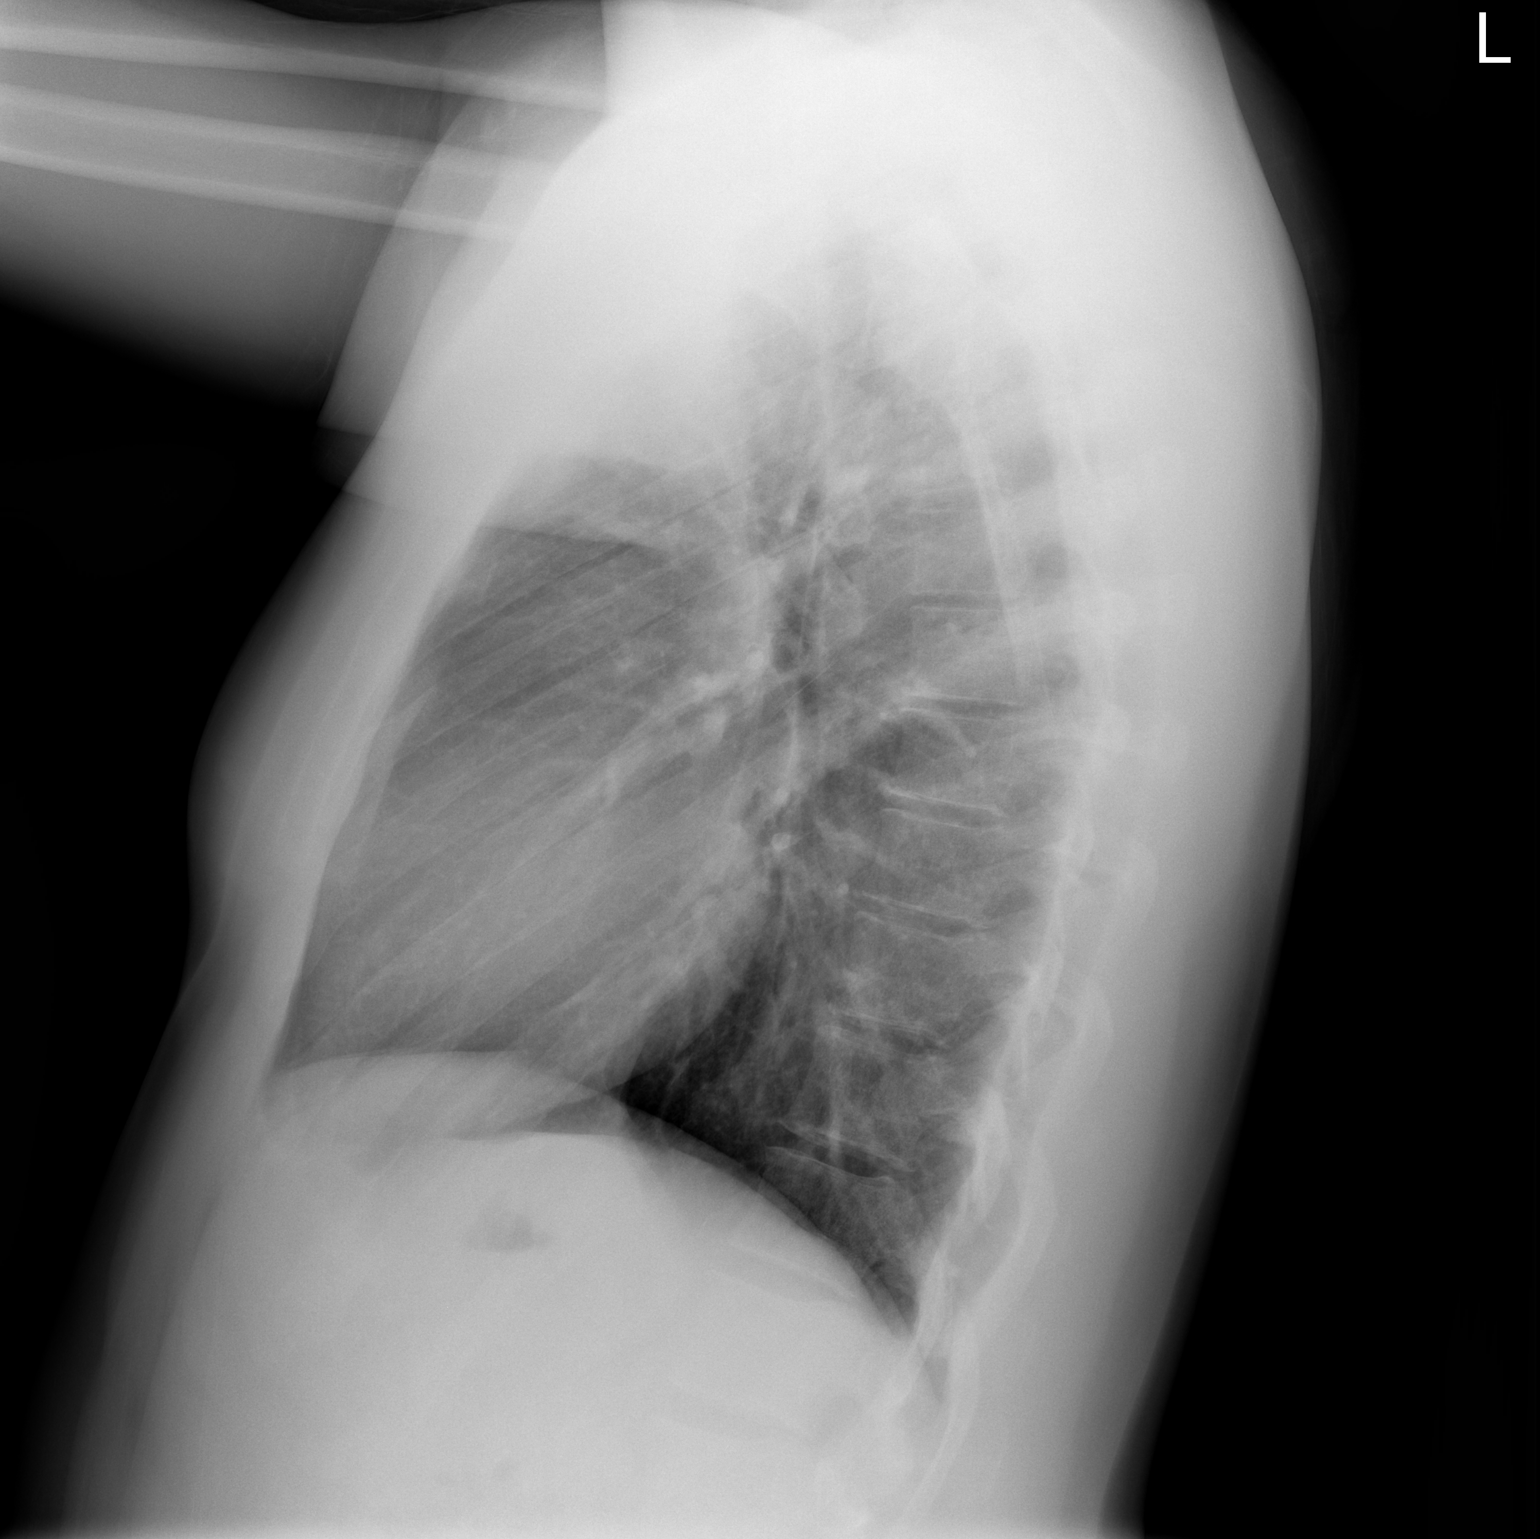

[2 of 2 positions shown; findings below may reference images not displayed]

FINDINGS: The heart size and mediastinal contours are within normal limits.
Both lungs are clear. The visualized skeletal structures are
unremarkable.
IMPRESSION: No active cardiopulmonary disease.

## 2016-08-31 ENCOUNTER — Ambulatory Visit (HOSPITAL_COMMUNITY)
Admission: EM | Admit: 2016-08-31 | Discharge: 2016-08-31 | Disposition: A | Discharge status: Home / Self Care | Payer: Medicaid Other

## 2019-08-04 ENCOUNTER — Telehealth: Payer: Self-pay | Admitting: Internal Medicine

## 2019-08-04 DIAGNOSIS — S93326A Dislocation of tarsometatarsal joint of unspecified foot, initial encounter: Secondary | ICD-10-CM | POA: Insufficient documentation

## 2019-08-04 NOTE — Telephone Encounter (Signed)
Ok

## 2019-08-05 ENCOUNTER — Other Ambulatory Visit: Payer: Self-pay

## 2019-08-05 ENCOUNTER — Encounter: Payer: Self-pay | Admitting: Internal Medicine

## 2019-08-05 ENCOUNTER — Ambulatory Visit (INDEPENDENT_AMBULATORY_CARE_PROVIDER_SITE_OTHER): Payer: Medicaid Other | Admitting: Internal Medicine

## 2019-08-05 VITALS — BP 110/71 | HR 68 | Temp 98.3°F | Resp 16 | Ht 73.0 in | Wt 210.0 lb

## 2019-08-05 DIAGNOSIS — N529 Male erectile dysfunction, unspecified: Secondary | ICD-10-CM | POA: Diagnosis not present

## 2019-08-05 MED ORDER — SILDENAFIL CITRATE 20 MG PO TABS: 60.0000 mg | ORAL_TABLET | Freq: Every evening | ORAL | 1 refills | Status: AC | PRN

## 2019-08-05 NOTE — Progress Notes (Signed)
Pre visit review using our clinic review tool, if applicable. No additional management support is needed unless otherwise documented below in the visit note. 

## 2019-08-05 NOTE — Patient Instructions (Signed)
Try sildenafil Watch for side effects

## 2019-08-05 NOTE — Progress Notes (Signed)
Subjective:    Patient ID: Ronald Patrick, male    DOB: 03/09/1991, 28 y.o.   MRN: [id number]  DOS:  08/05/2019 Type of visit - description:  NEW patient, acute Having difficulty with erection for the last 3 weeks, he is able to get erection but they don't  last too long. He is currently getting separated from the wife has moved in w/ his girlfriend.   Review of Systems Denies fatigue or severe changes in his weight Muscle strength is normal Libido normal Admits to some stress, he is however sleeping well. Denies any LUTs  Past Medical History:  Diagnosis Date  . Allergic rhinitis   . Chronic tonsillitis   . Lisfranc dislocation    L    Past Surgical History:  Procedure Laterality Date  . NO PAST SURGERIES       Family History  Problem Relation Age of Onset  . Diabetes Other        GM, late onset  . CAD Neg Hx   . Colon cancer Neg Hx   . Prostate cancer Neg Hx     Social History   Socioeconomic History  . Marital status: Married    Spouse name: Not on file  . Number of children: 1  . Years of education: Not on file  . Highest education level: Not on file  Occupational History  . 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 Financial resource strain: Not on file  . Food insecurity    Worry: Not on file    Inability: Not on file  . Transportation needs    Medical: Not on file    Non-medical: Not on file  Tobacco Use  . Smoking status: Current Every Day Smoker    Types: Cigars  . Smokeless tobacco: Never Used  Substance and Sexual Activity  . Alcohol use: Yes    Comment: occ  . Drug use: No  . Sexual activity: Not on file  Lifestyle  . Physical activity    Days per week: Not on file    Minutes per session: Not on file  . Stress: Not on file  Relationships  . Social Musicianconnections    Talks on phone: Not on file    Gets together: Not on file    Attends religious service: Not on file    Active member of club or  organization: Not on file    Attends meetings of clubs or organizations: Not on file    Relationship status: Not on file  . Intimate partner violence    Fear of current or ex partner: Not on file    Emotionally abused: Not on file    Physically abused: Not on file    Forced sexual activity: Not on file  Other Topics Concern  . Not on file  Social History Narrative   Finished high school 2012 .   Separating from wife   [place]/ [place]      Allergies as of 08/05/2019   No Known Allergies     Medication List       Accurate as of August 05, 2019 11:59 PM. If you have any questions, ask your nurse or doctor.        STOP taking these medications   acetaminophen 325 MG tablet Commonly known as: Tylenol Stopped by: Willow OraJose Borden Thune, MD   benzonatate 100 MG capsule Commonly known as: TESSALON Stopped by: Willow OraJose Ataya Murdy, MD  ibuprofen 600 MG tablet Commonly known as: ADVIL Stopped by: Kathlene November, MD   ondansetron 4 MG tablet Commonly known as: Zofran Stopped by: Kathlene November, MD     TAKE these medications   sildenafil 20 MG tablet Commonly known as: REVATIO Take 3-4 tablets (60-80 mg total) by mouth at bedtime as needed. Started by: Kathlene November, MD           Objective:   Physical Exam BP 110/71 (BP Location: Left Arm, Patient Position: Sitting, Cuff Size: Small)   Pulse 68   Temp 98.3 F (36.8 C) (Temporal)   Resp 16   Ht 6\' 1"  (1.854 m)   Wt 210 lb (95.3 kg)   SpO2 97%   BMI 27.71 kg/m  General:   Well developed, NAD, BMI noted.  HEENT:  Normocephalic . Face symmetric, atraumatic Neck: No thyromegaly Lungs:  CTA B Normal respiratory effort, no intercostal retractions, no accessory muscle use. Heart: RRR,  no murmur.  no pretibial edema bilaterally  Abdomen:  Not distended, soft, non-tender. No rebound or rigidity. GU: Normal scrotal contents, penis normal Skin: Not pale. Not jaundice Neurologic:  alert & oriented X3.  Speech normal, gait appropriate  for age and unassisted Psych--  Cognition and judgment appear intact.  Cooperative with normal attention span and concentration.  Behavior appropriate. No anxious or depressed appearing.     Assessment    Assessment (new patient, reestablished 07-2019) Healthy   PLAN Erectile dysfunction: Symptoms started 3 weeks ago, patient worries about low testosterone, he has clinically no evidence of hypogonadism. He admits to being under stress, see social history and review of systems. Recommend a trial with sildenafil to regain his self-confidence How to use it, what to expect and side effects discussed.  ER if he has persisting or painful erection.  He verbalized understanding. Recent food fracture: Per Ortho. RTC for a CPX at his convenience

## 2019-08-06 DIAGNOSIS — Z09 Encounter for follow-up examination after completed treatment for conditions other than malignant neoplasm: Secondary | ICD-10-CM | POA: Insufficient documentation

## 2019-08-06 NOTE — Assessment & Plan Note (Signed)
Erectile dysfunction: Symptoms started 3 weeks ago, patient worries about low testosterone, he has clinically no evidence of hypogonadism. He admits to being under stress, see social history and review of systems. Recommend a trial with sildenafil to regain his self-confidence How to use it, what to expect and side effects discussed.  ER if he has persisting or painful erection.  He verbalized understanding. Recent food fracture: Per Ortho. RTC for a CPX at his convenience

## 2019-10-20 ENCOUNTER — Telehealth: Payer: Self-pay

## 2019-10-20 NOTE — Telephone Encounter (Signed)
Appt scheduled

## 2019-10-20 NOTE — Telephone Encounter (Signed)
Copied from [place] [id number]. Topic: General - Other >> Oct 20, 2019 12:19 PM Celene Kras A wrote: Reason for CRM: Pt called and is requesting to have std testing done. Please advise.

## 2019-10-20 NOTE — Telephone Encounter (Signed)
Please schedule visit at his convenience.

## 2019-10-23 ENCOUNTER — Ambulatory Visit: Payer: Medicaid Other | Admitting: Internal Medicine

## 2019-10-23 ENCOUNTER — Other Ambulatory Visit: Payer: Self-pay

## 2019-10-24 ENCOUNTER — Other Ambulatory Visit (HOSPITAL_COMMUNITY)
Admission: RE | Admit: 2019-10-24 | Discharge: 2019-10-24 | Disposition: A | Discharge status: Home / Self Care | Payer: Medicaid Other | Source: Ambulatory Visit | Attending: Internal Medicine | Admitting: Internal Medicine

## 2019-10-24 ENCOUNTER — Ambulatory Visit (INDEPENDENT_AMBULATORY_CARE_PROVIDER_SITE_OTHER): Payer: Medicaid Other | Admitting: Internal Medicine

## 2019-10-24 ENCOUNTER — Other Ambulatory Visit: Payer: Self-pay

## 2019-10-24 ENCOUNTER — Encounter: Payer: Self-pay | Admitting: Internal Medicine

## 2019-10-24 VITALS — BP 134/77 | HR 90 | Temp 98.1°F | Resp 16 | Ht 73.0 in | Wt 211.0 lb

## 2019-10-24 DIAGNOSIS — Z0000 Encounter for general adult medical examination without abnormal findings: Secondary | ICD-10-CM

## 2019-10-24 DIAGNOSIS — Z113 Encounter for screening for infections with a predominantly sexual mode of transmission: Secondary | ICD-10-CM | POA: Diagnosis not present

## 2019-10-24 NOTE — Progress Notes (Signed)
Pre visit review using our clinic review tool, if applicable. No additional management support is needed unless otherwise documented below in the visit note. 

## 2019-10-24 NOTE — Progress Notes (Signed)
Subjective:    Patient ID: Ronald Patrick, male    DOB: 12-20-1991, 28 y.o.   MRN: [id number]  DOS:  10/24/2019 Type of visit - description: BX No major concerns except that he likes STD checks.  Review of Systems He specifically denies dysuria, gross hematuria, penile discharge.  Other than above, a 14 point review of systems is negative      Past Medical History:  Diagnosis Date  . Allergic rhinitis   . Chronic tonsillitis   . Lisfranc dislocation    L    Past Surgical History:  Procedure Laterality Date  . NO PAST SURGERIES      Social History   Socioeconomic History  . Marital status: Legally Separated    Spouse name: Not on file  . Number of children: 1  . Years of education: Not on file  . Highest education level: Not on file  Occupational History  . 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 Financial resource strain: Not on file  . Food insecurity    Worry: Not on file    Inability: Not on file  . Transportation needs    Medical: Not on file    Non-medical: Not on file  Tobacco Use  . Smoking status: Current Every Day Smoker    Types: Cigars  . Smokeless tobacco: Never Used  . Tobacco comment: occ cigar  Substance and Sexual Activity  . Alcohol use: Yes    Comment: occ  . Drug use: No  . Sexual activity: Not on file  Lifestyle  . Physical activity    Days per week: Not on file    Minutes per session: Not on file  . Stress: Not on file  Relationships  . Social Musician on phone: Not on file    Gets together: Not on file    Attends religious service: Not on file    Active member of club or organization: Not on file    Attends meetings of clubs or organizations: Not on file    Relationship status: Not on file  . Intimate partner violence    Fear of current or ex partner: Not on file    Emotionally abused: Not on file    Physically abused: Not on file    Forced sexual activity: Not on file   Other Topics Concern  . Not on file  Social History Narrative   Finished high school 2012 .   Separating from wife   Lives in [place] by himself         Family History  Problem Relation Age of Onset  . Diabetes Other        GM, late onset  . CAD Neg Hx   . Colon cancer Neg Hx   . Prostate cancer Neg Hx      Allergies as of 10/24/2019   No Known Allergies     Medication List       Accurate as of October 24, 2019 11:59 PM. If you have any questions, ask your nurse or doctor.        sildenafil 20 MG tablet Commonly known as: REVATIO Take 3-4 tablets (60-80 mg total) by mouth at bedtime as needed.           Objective:   Physical Exam BP 134/77 (BP Location: Left Arm, Patient Position: Sitting, Cuff Size: Normal)   Pulse 90   Temp 98.1 F (36.7 C) (  Temporal)   Resp 16   Ht 6\' 1"  (1.854 m)   Wt 211 lb (95.7 kg)   SpO2 100%   BMI 27.84 kg/m  General: Well developed, NAD, BMI noted Neck: No  thyromegaly  HEENT:  Normocephalic . Face symmetric, atraumatic Lungs:  CTA B Normal respiratory effort, no intercostal retractions, no accessory muscle use. Heart: RRR,  no murmur.  No pretibial edema bilaterally  Abdomen:  Not distended, soft, non-tender. No rebound or rigidity.   Skin: Exposed areas without rash. Not pale. Not jaundice Neurologic:  alert & oriented X3.  Speech normal, gait appropriate for age and unassisted Strength symmetric and appropriate for age.  Psych: Cognition and judgment appear intact.  Cooperative with normal attention span and concentration.  Behavior appropriate. No anxious or depressed appearing.     Assessment     Assessment (new patient, reestablished 07-2019) Healthy   PLAN Here for CPX, doing well RTC 1 year

## 2019-10-24 NOTE — Patient Instructions (Signed)
GO TO THE LAB : Get the blood work     GO TO THE FRONT DESK Schedule your next appointment    for a physical exam in 1 year     Safe Sex Practicing safe sex means taking steps before and during sex to reduce your risk of:  Getting an STI (sexually transmitted infection).  Giving your partner an STI.  Unwanted or unplanned pregnancy. How can I practice safe sex?     Ways you can practice safe sex  Limit your sexual partners to only one partner who is having sex with only you.  Avoid using alcohol and drugs before having sex. Alcohol and drugs can affect your judgment.  Before having sex with a new partner: ? Talk to your partner about past partners, past STIs, and drug use. ? Get screened for STIs and discuss the results with your partner. Ask your partner to get screened, too.  Check your body regularly for sores, blisters, rashes, or unusual discharge. If you notice any of these problems, visit your health care provider.  Avoid sexual contact if you have symptoms of an infection or you are being treated for an STI.  While having sex, use a condom. Make sure to: ? Use a condom every time you have vaginal, oral, or anal sex. Both females and males should wear condoms during oral sex. ? Keep condoms in place from the beginning to the end of sexual activity. ? Use a latex condom, if possible. Latex condoms offer the best protection. ? Use only water-based lubricants with a condom. Using petroleum-based lubricants or oils will weaken the condom and increase the chance that it will break. Ways your health care provider can help you practice safe sex  See your health care provider for regular screenings, exams, and tests for STIs.  Talk with your health care provider about what kind of birth control (contraception) is best for you.  Get vaccinated against hepatitis B and human papillomavirus (HPV).  If you are at risk of being infected with HIV (human immunodeficiency virus),  talk with your health care provider about taking a prescription medicine to prevent HIV infection. You are at risk for HIV if you: ? Are a man who has sex with other men. ? Are sexually active with more than one partner. ? Take drugs by injection. ? Have a sex partner who has HIV. ? Have unprotected sex. ? Have sex with someone who has sex with both men and women. ? Have had an STI. Follow these instructions at home:  Take over-the-counter and prescription medicines as told by your health care provider.  Keep all follow-up visits as told by your health care provider. This is important. Where to find more information  Centers for Disease Control and Prevention: LessFurniture.be  Planned Parenthood: https://www.plannedparenthood.org/  Office on Women's Health: EmploymentTracking.tn Summary  Practicing safe sex means taking steps before and during sex to reduce your risk of STIs, giving your partner STIs, and having an unwanted or unplanned pregnancy.  Before having sex with a new partner, talk to your partner about past partners, past STIs, and drug use.  Use a condom every time you have vaginal, oral, or anal sex. Both females and males should wear condoms during oral sex.  Check your body regularly for sores, blisters, rashes, or unusual discharge. If you notice any of these problems, visit your health care provider.  See your health care provider for regular screenings, exams, and tests for STIs. This information  is not intended to replace advice given to you by your health care provider. Make sure you discuss any questions you have with your health care provider. Document Released: 01/18/2005 Document Revised: 04/04/2019 Document Reviewed: 09/23/2018 Elsevier Patient Education  2020 [employer].

## 2019-10-26 LAB — CBC WITH DIFFERENTIAL/PLATELET
Absolute Monocytes: 970 cells/uL — ABNORMAL HIGH (ref 200–950)
Basophils Absolute: 39 cells/uL (ref 0–200)
Basophils Relative: 0.4 %
Eosinophils Absolute: 126 cells/uL (ref 15–500)
Eosinophils Relative: 1.3 %
HCT: 44.7 % (ref 38.5–50.0)
Hemoglobin: 15.9 g/dL (ref 13.2–17.1)
Lymphs Abs: 2561 cells/uL (ref 850–3900)
MCH: 33.2 pg — ABNORMAL HIGH (ref 27.0–33.0)
MCHC: 35.6 g/dL (ref 32.0–36.0)
MCV: 93.3 fL (ref 80.0–100.0)
MPV: 11.5 fL (ref 7.5–12.5)
Monocytes Relative: 10 %
Neutro Abs: 6004 cells/uL (ref 1500–7800)
Neutrophils Relative %: 61.9 %
Platelets: 271 10*3/uL (ref 140–400)
RBC: 4.79 10*6/uL (ref 4.20–5.80)
RDW: 12.7 % (ref 11.0–15.0)
Total Lymphocyte: 26.4 %
WBC: 9.7 10*3/uL (ref 3.8–10.8)

## 2019-10-26 LAB — COMPREHENSIVE METABOLIC PANEL
AG Ratio: 2 (calc) (ref 1.0–2.5)
ALT: 16 U/L (ref 9–46)
AST: 15 U/L (ref 10–40)
Albumin: 4.8 g/dL (ref 3.6–5.1)
Alkaline phosphatase (APISO): 37 U/L (ref 36–130)
BUN/Creatinine Ratio: 9 (calc) (ref 6–22)
BUN: 12 mg/dL (ref 7–25)
CO2: 29 mmol/L (ref 20–32)
Calcium: 9.9 mg/dL (ref 8.6–10.3)
Chloride: 103 mmol/L (ref 98–110)
Creat: 1.39 mg/dL — ABNORMAL HIGH (ref 0.60–1.35)
Globulin: 2.4 g/dL (calc) (ref 1.9–3.7)
Glucose, Bld: 84 mg/dL (ref 65–99)
Potassium: 4.2 mmol/L (ref 3.5–5.3)
Sodium: 140 mmol/L (ref 135–146)
Total Bilirubin: 0.9 mg/dL (ref 0.2–1.2)
Total Protein: 7.2 g/dL (ref 6.1–8.1)

## 2019-10-26 LAB — TSH: TSH: 0.43 mIU/L (ref 0.40–4.50)

## 2019-10-26 LAB — RPR: RPR Ser Ql: NONREACTIVE

## 2019-10-26 LAB — LIPID PANEL
Cholesterol: 183 mg/dL (ref <200)
HDL: 56 mg/dL (ref >=40)
LDL Cholesterol (Calc): 98 mg/dL (calc)
Non-HDL Cholesterol (Calc): 127 mg/dL (calc) (ref <130)
Total CHOL/HDL Ratio: 3.3 (calc) (ref <5.0)
Triglycerides: 203 mg/dL — ABNORMAL HIGH (ref <150)

## 2019-10-26 LAB — HIV ANTIBODY (ROUTINE TESTING W REFLEX): HIV 1&2 Ab, 4th Generation: NONREACTIVE

## 2019-10-26 NOTE — Assessment & Plan Note (Deleted)
Tdap 2013 He would benefit for a HPV and a flu shot.  Declined all shots.  Benefits discussed  labs: HIV, RPR, G&C, CMP, FLP, CBC, TSH. Patient education: Diet, exercise and safe sex discussed. Also self testicular exam avoidance of tobacco products. RTC 1 year

## 2019-10-26 NOTE — Assessment & Plan Note (Signed)
Tdap 2013 He would benefit for a HPV and a flu shot.  Declined all shots.  Benefits discussed  labs: HIV, RPR, G&C, CMP, FLP, CBC, TSH. Patient education: Diet, exercise and safe sex discussed. Also self testicular exam avoidance of tobacco products. RTC 1 year  

## 2019-10-26 NOTE — Assessment & Plan Note (Signed)
Here for CPX, doing well RTC 1 year 

## 2019-10-27 LAB — URINE CYTOLOGY ANCILLARY ONLY
Chlamydia: NEGATIVE
Comment: NEGATIVE
Comment: NORMAL
Neisseria Gonorrhea: NEGATIVE

## 2019-10-30 ENCOUNTER — Other Ambulatory Visit: Payer: Self-pay

## 2019-10-30 DIAGNOSIS — Z20822 Contact with and (suspected) exposure to covid-19: Secondary | ICD-10-CM

## 2019-11-01 ENCOUNTER — Telehealth: Payer: Self-pay

## 2019-11-01 LAB — NOVEL CORONAVIRUS, NAA: SARS-CoV-2, NAA: DETECTED — AB

## 2019-11-01 NOTE — Telephone Encounter (Signed)
Pt given Covid-19 positive results. Discussed mild, moderate and severe symptoms. Advised pt to call 911 for any respiratory issues and/dehydration. Discussed non test criteria for ending self isolation. Pt advised of way to manage symptoms at home and review isolation precautions especially the importance of washing hands frequently and wearing a mask when around others. Pt verbalized understanding. [facility] to be notified.

## 2019-11-11 ENCOUNTER — Other Ambulatory Visit: Payer: Self-pay

## 2019-11-11 DIAGNOSIS — Z20822 Contact with and (suspected) exposure to covid-19: Secondary | ICD-10-CM

## 2019-11-13 LAB — NOVEL CORONAVIRUS, NAA: SARS-CoV-2, NAA: NOT DETECTED

## 2020-08-19 ENCOUNTER — Ambulatory Visit: Payer: Medicaid Other | Admitting: Medical

## 2020-10-29 ENCOUNTER — Encounter: Payer: Self-pay | Admitting: Internal Medicine

## 2020-10-29 ENCOUNTER — Encounter: Payer: Medicaid Other | Admitting: Internal Medicine

## 2020-11-10 ENCOUNTER — Telehealth (INDEPENDENT_AMBULATORY_CARE_PROVIDER_SITE_OTHER): Payer: Medicaid Other | Admitting: Medical

## 2020-11-10 ENCOUNTER — Other Ambulatory Visit: Payer: Self-pay

## 2020-11-10 VITALS — Temp 99.2°F

## 2020-11-10 DIAGNOSIS — J3489 Other specified disorders of nose and nasal sinuses: Secondary | ICD-10-CM | POA: Diagnosis not present

## 2020-11-10 DIAGNOSIS — R0981 Nasal congestion: Secondary | ICD-10-CM | POA: Diagnosis not present

## 2020-11-10 DIAGNOSIS — J029 Acute pharyngitis, unspecified: Secondary | ICD-10-CM

## 2020-11-10 MED ORDER — AZITHROMYCIN 250 MG PO TABS: ORAL_TABLET | ORAL | 0 refills | Status: AC

## 2020-11-10 MED ORDER — FLUTICASONE PROPIONATE 50 MCG/ACT NA SUSP: 2.0000 | Freq: Every day | NASAL | 1 refills | Status: AC

## 2020-11-10 MED ORDER — LEVOCETIRIZINE DIHYDROCHLORIDE 5 MG PO TABS: 5.0000 mg | ORAL_TABLET | Freq: Every evening | ORAL | 3 refills | Status: AC

## 2020-11-10 NOTE — Patient Instructions (Signed)
Your symptoms may be allergic vs uri.   Will rx flonase nasal spray and xyzal  . Making azithromycin available if signs/symptoms sinus infection, bronchitis or if worse st.  Get test for covid with rapid today and wed. If both negative and feeling better can return to work on Thursday.  Follow up 7 days or as needed

## 2020-11-10 NOTE — Progress Notes (Signed)
° °  Subjective:    Patient ID: Ronald Patrick, male    DOB: [birth year], 29 y.o.   MRN: [id number]  HPI  Virtual Visit via Video Note  I connected with Ronald Patrick on 11/10/20 at  2:40 PM EST by a video enabled telemedicine application and verified that I am speaking with the correct person using two identifiers.  Location: Patient: home Provider: office   I discussed the limitations of evaluation and management by telemedicine and the availability of in person appointments. The patient expressed understanding and agreed to proceed.  History of Present Illness: Pt had one day of st, sinus pressure, nasal congestion and dry cough. No body aches, chills or fever. Normal smell and taste.  Pt states not vaccinated against covid. He did have covid back in December.  Pt wife recently got sick. She had viral illness. Mostly gi symptoms.    Observations/Objective: General-no acute distress, pleasant, oriented. Lungs- on inspection lungs appear unlabored. Neck- no tracheal deviation or jvd on inspection. Neuro- gross motor function appears intact.   Assessment and Plan: Your symptoms may be allergic vs uri.   Will rx flonase nasal spray and xyzal  . Making azithromycin available if signs/symptoms sinus infection, bronchitis or if worse st.  Get test for covid with rapid today and wed. If both negative and feeling better can return to work on Thursday.  Follow up 7 days or as needed  Follow Up Instructions:    I discussed the assessment and treatment plan with the patient. The patient was provided an opportunity to ask questions and all were answered. The patient agreed with the plan and demonstrated an understanding of the instructions.   The patient was advised to call back or seek an in-person evaluation if the symptoms worsen or if the condition fails to improve as anticipated.  Time spent with patient today was  20 minutes which consisted of chart revdiew, discussing  diagnosis, work up treatment and documentation.   Esperanza Richters, PA-C   Review of Systems  Constitutional: Negative for fatigue.  HENT: Positive for congestion, postnasal drip, sinus pain and sore throat. Negative for ear pain.   Respiratory: Positive for cough. Negative for wheezing.   Cardiovascular: Negative for chest pain and palpitations.  Gastrointestinal: Negative for abdominal pain.  Musculoskeletal: Negative for back pain, joint swelling, myalgias and neck stiffness.  Skin: Negative for rash.       Objective:   Physical Exam        Assessment & Plan:

## 2020-12-29 ENCOUNTER — Other Ambulatory Visit: Payer: Medicaid Other

## 2020-12-29 DIAGNOSIS — Z20822 Contact with and (suspected) exposure to covid-19: Secondary | ICD-10-CM

## 2021-01-01 LAB — NOVEL CORONAVIRUS, NAA: SARS-CoV-2, NAA: DETECTED — AB

## 2021-01-20 ENCOUNTER — Other Ambulatory Visit: Payer: Self-pay

## 2021-01-20 ENCOUNTER — Encounter: Payer: Self-pay | Admitting: Internal Medicine

## 2021-01-20 ENCOUNTER — Ambulatory Visit (INDEPENDENT_AMBULATORY_CARE_PROVIDER_SITE_OTHER): Payer: Medicaid Other | Admitting: Internal Medicine

## 2021-01-20 VITALS — BP 117/71 | HR 84 | Temp 98.2°F | Resp 16 | Ht 73.0 in | Wt 218.1 lb

## 2021-01-20 DIAGNOSIS — Z113 Encounter for screening for infections with a predominantly sexual mode of transmission: Secondary | ICD-10-CM

## 2021-01-20 DIAGNOSIS — Z0000 Encounter for general adult medical examination without abnormal findings: Secondary | ICD-10-CM

## 2021-01-20 DIAGNOSIS — Z1159 Encounter for screening for other viral diseases: Secondary | ICD-10-CM

## 2021-01-20 LAB — URINALYSIS, ROUTINE W REFLEX MICROSCOPIC
Bilirubin Urine: NEGATIVE
Hgb urine dipstick: NEGATIVE
Ketones, ur: NEGATIVE
Leukocytes,Ua: NEGATIVE
Nitrite: NEGATIVE
RBC / HPF: NONE SEEN (ref 0–?)
Specific Gravity, Urine: >=1.03 — AB (ref 1.000–1.030)
Total Protein, Urine: NEGATIVE
Urine Glucose: NEGATIVE
Urobilinogen, UA: 1 (ref 0.0–1.0)
WBC, UA: NONE SEEN (ref 0–?)
pH: 6 (ref 5.0–8.0)

## 2021-01-20 LAB — LIPID PANEL
Cholesterol: 209 mg/dL — ABNORMAL HIGH (ref 0–200)
HDL: 54.4 mg/dL (ref >39.00)
LDL Cholesterol: 142 mg/dL — ABNORMAL HIGH (ref 0–99)
NonHDL: 154.72
Total CHOL/HDL Ratio: 4
Triglycerides: 62 mg/dL (ref 0.0–149.0)
VLDL: 12.4 mg/dL (ref 0.0–40.0)

## 2021-01-20 LAB — COMPREHENSIVE METABOLIC PANEL
ALT: 35 U/L (ref 0–53)
AST: 16 U/L (ref 0–37)
Albumin: 4.6 g/dL (ref 3.5–5.2)
Alkaline Phosphatase: 29 U/L — ABNORMAL LOW (ref 39–117)
BUN: 15 mg/dL (ref 6–23)
CO2: 30 mEq/L (ref 19–32)
Calcium: 9.7 mg/dL (ref 8.4–10.5)
Chloride: 104 mEq/L (ref 96–112)
Creatinine, Ser: 1.27 mg/dL (ref 0.40–1.50)
GFR: 77.34 mL/min (ref >60.00)
Glucose, Bld: 91 mg/dL (ref 70–99)
Potassium: 4.2 mEq/L (ref 3.5–5.1)
Sodium: 139 mEq/L (ref 135–145)
Total Bilirubin: 0.5 mg/dL (ref 0.2–1.2)
Total Protein: 7.4 g/dL (ref 6.0–8.3)

## 2021-01-20 LAB — CBC WITH DIFFERENTIAL/PLATELET
Basophils Absolute: 0 10*3/uL (ref 0.0–0.1)
Basophils Relative: 0.4 % (ref 0.0–3.0)
Eosinophils Absolute: 0.2 10*3/uL (ref 0.0–0.7)
Eosinophils Relative: 1.4 % (ref 0.0–5.0)
HCT: 46.4 % (ref 39.0–52.0)
Hemoglobin: 15.4 g/dL (ref 13.0–17.0)
Lymphocytes Relative: 21.7 % (ref 12.0–46.0)
Lymphs Abs: 2.7 10*3/uL (ref 0.7–4.0)
MCHC: 33.1 g/dL (ref 30.0–36.0)
MCV: 96.9 fl (ref 78.0–100.0)
Monocytes Absolute: 1.2 10*3/uL — ABNORMAL HIGH (ref 0.1–1.0)
Monocytes Relative: 9.6 % (ref 3.0–12.0)
Neutro Abs: 8.3 10*3/uL — ABNORMAL HIGH (ref 1.4–7.7)
Neutrophils Relative %: 66.9 % (ref 43.0–77.0)
Platelets: 244 10*3/uL (ref 150.0–400.0)
RBC: 4.79 Mil/uL (ref 4.22–5.81)
RDW: 13.8 % (ref 11.5–15.5)
WBC: 12.5 10*3/uL — ABNORMAL HIGH (ref 4.0–10.5)

## 2021-01-20 LAB — TSH: TSH: 2.01 u[IU]/mL (ref 0.35–4.50)

## 2021-01-20 NOTE — Progress Notes (Unsigned)
Pre visit review using our clinic review tool, if applicable. No additional management support is needed unless otherwise documented below in the visit note. 

## 2021-01-20 NOTE — Progress Notes (Signed)
Subjective:    Patient ID: Ronald Patrick, male    DOB: 1991-12-11, 30 y.o.   MRN: [id number]  DOS:  01/20/2021 Type of visit - description: CPX  Since the last office visit he is doing well. Has no major concerns except he likes to repeat all STD checks. Denies dysuria, penile discharge, no  genital rash. Condom use is "50-50"   Review of Systems  Other than above, a 14 point review of systems is negative      Past Medical History:  Diagnosis Date  . Allergic rhinitis   . Chronic tonsillitis   . Lisfranc dislocation    L    Past Surgical History:  Procedure Laterality Date  . NO PAST SURGERIES      Social History   Socioeconomic History  . Marital status: Legally Separated    Spouse name: Not on file  . Number of children: 1  . Years of education: Not on file  . Highest education level: Not on file  Occupational History  . Occupation:  self employeed     Comment: [place]   . Occupation: Merchant navy officer   Tobacco Use  . Smoking status: Current Every Day Smoker    Types: Cigars  . Smokeless tobacco: Never Used  . Tobacco comment: 1-2 cigars a day   Substance and Sexual Activity  . Alcohol use: Yes    Comment: occ  . Drug use: No  . Sexual activity: Not on file  Other Topics Concern  . Not on file  Social History Narrative   Finished high school 2012 .   Separated from wife   Lives in [place] by himself       Social Determinants of Health   Financial Resource Strain: Not on file  Food Insecurity: Not on file  Transportation Needs: Not on file  Physical Activity: Not on file  Stress: Not on file  Social Connections: Not on file  Intimate Partner Violence: Not on file      Allergies as of 01/20/2021   No Known Allergies     Medication List       Accurate as of January 20, 2021 11:59 PM. If you have any questions, ask your nurse or doctor.        azithromycin 250 MG tablet Commonly known as: ZITHROMAX Take 2 tablets by mouth on day 1,  followed by 1 tablet by mouth daily for 4 days.   fluticasone 50 MCG/ACT nasal spray Commonly known as: FLONASE Place 2 sprays into both nostrils daily.   levocetirizine 5 MG tablet Commonly known as: XYZAL Take 1 tablet (5 mg total) by mouth every evening.   sildenafil 20 MG tablet Commonly known as: REVATIO Take 3-4 tablets (60-80 mg total) by mouth at bedtime as needed.          Objective:   Physical Exam BP 117/71 (BP Location: Left Arm, Patient Position: Sitting, Cuff Size: Normal)   Pulse 84   Temp 98.2 F (36.8 C) (Oral)   Resp 16   Ht 6\' 1"  (1.854 m)   Wt 218 lb 2 oz (98.9 kg)   SpO2 96%   BMI 28.78 kg/m  General: Well developed, NAD, BMI noted Neck: No  thyromegaly  HEENT:  Normocephalic . Face symmetric, atraumatic Lungs:  CTA B Normal respiratory effort, no intercostal retractions, no accessory muscle use. Heart: RRR,  no murmur.  Abdomen:  Not distended, soft, non-tender. No rebound or rigidity.   Lower extremities: no pretibial  edema bilaterally  Skin: Exposed areas without rash. Not pale. Not jaundice Neurologic:  alert & oriented X3.  Speech normal, gait appropriate for age and unassisted Strength symmetric and appropriate for age.  Psych: Cognition and judgment appear intact.  Cooperative with normal attention span and concentration.  Behavior appropriate. No anxious or depressed appearing.     Assessment     Assessment (new patient, reestablished 07-2019) Healthy  + Covid dz 10-2020  PLAN Here for CPX, doing well RTC 1 year

## 2021-01-20 NOTE — Patient Instructions (Signed)
GO TO THE LAB : Get the blood work     GO TO THE FRONT DESK, PLEASE SCHEDULE YOUR APPOINTMENTS Come back for a physical exam in 1 year   Testicular Self-Exam A self-examination of your testicles (testicular self-exam) involves looking at and feeling your testicles for abnormal lumps or swelling. Several things can cause swelling, lumps, or pain in your testicles. Some of these causes are:  Injuries.  Inflammation.  Infection.  Buildup of fluids around the testicle (hydrocele).  Twisted testicles (testicular torsion).  Testicular cancer. You may be at risk for testicular cancer if you have: ? An undescended testicle (cryptorchidism). ? A history of previous testicular cancer. ? A family history of testicular cancer. General tips and recommendations  The testicles are easiest to examine after a warm bath or shower. They are more difficult to examine when you are cold because the muscles attached to the testicles retract and pull them up higher or into the abdomen.  A normal testicle is egg-shaped and feels firm. It is smooth and not tender.  It is normal to feel a firm, spaghetti-like cord at the back of your testicle. This is the spermatic cord. How to do a testicular self-exam 1. Stand and hold your penis away from your body. 2. Look at each testicle to check for changes in appearance, such as swelling or changes in size or shape. 3. Roll each testicle between your thumb and forefinger, feeling the entire testicle. Feel for: ? Lumps. ? Swelling. ? Discomfort. 4. Check the groin area between your abdomen and upper thighs on both sides of your body. Look and feel for any swelling or bumps that are tender. These could be enlarged lymph nodes.   Contact a health care provider if:  You find any bumps or lumps, such as a small, hard, pea-sized lump.  You find swelling, pain, or soreness.  You see or feel any other changes in your testicles. Summary  A self-examination  of your testicles (testicular self-exam) involves looking at and feeling your testicles for any changes.  Check each of your testicles for lumps, swelling, or discomfort. These changes can be caused by many things.  Check for swelling or tender bumps in your groin area between your lower abdomen and upper thighs. This information is not intended to replace advice given to you by your health care provider. Make sure you discuss any questions you have with your health care provider. Document Revised: 11/17/2019 Document Reviewed: 11/17/2019 Elsevier Patient Education  2021 [employer].  Safe Sex Practicing safe sex means taking steps before and during sex to reduce your risk of:  Getting an STI (sexually transmitted infection).  Giving your partner an STI.  Unwanted or unplanned pregnancy. How to practice safe sex Ways you can practice safe sex  Limit your sexual partners to only one partner who is having sex with only you.  Avoid using alcohol and drugs before having sex. Alcohol and drugs can affect your judgment.  Before having sex with a new partner: ? Talk to your partner about past partners, past STIs, and drug use. ? Get screened for STIs and discuss the results with your partner. Ask your partner to get screened too.  Check your body regularly for sores, blisters, rashes, or unusual discharge. If you notice any of these problems, visit your health care provider.  Avoid sexual contact if you have symptoms of an infection or you are being treated for an STI.  While having sex, use  a condom. Make sure to: ? Use a condom every time you have vaginal, oral, or anal sex. Both females and males should wear condoms during oral sex. ? Keep condoms in place from the beginning to the end of sexual activity. ? Use a latex condom, if possible. Latex condoms offer the best protection. ? Use only water-based lubricants with a condom. Using petroleum-based lubricants or oils will weaken  the condom and increase the chance that it will break.   Ways your health care provider can help you practice safe sex  See your health care provider for regular screenings, exams, and tests for STIs.  Talk with your health care provider about what kind of birth control (contraception) is best for you.  Get vaccinated against hepatitis B and human papillomavirus (HPV).  If you are at risk of being infected with HIV (human immunodeficiency virus), talk with your health care provider about taking a prescription medicine to prevent HIV infection. You are at risk for HIV if you: ? Are a man who has sex with other men. ? Are sexually active with more than one partner. ? Take drugs by injection. ? Have a sex partner who has HIV. ? Have unprotected sex. ? Have sex with someone who has sex with both men and women. ? Have had an STI.   Follow these instructions at home:  Take over-the-counter and prescription medicines only as told by your health care provider.  Keep all follow-up visits. This is important. Where to find more information  Centers for Disease Control and Prevention: FootballExhibition.com.br  Planned Parenthood: www.plannedparenthood.org  Office on [employer] Health: http://hoffman.com/ Summary  Practicing safe sex means taking steps before and during sex to reduce your risk getting an STI, giving your partner an STI, and having an unwanted or unplanned pregnancy.  Before having sex with a new partner, talk to your partner about past partners, past STIs, and drug use.  Use a condom every time you have vaginal, oral, or anal sex. Both females and males should wear condoms during oral sex.  Check your body regularly for sores, blisters, rashes, or unusual discharge. If you notice any of these problems, visit your health care provider.  See your health care provider for regular screenings, exams, and tests for STIs. This information is not intended to replace advice given to you by your  health care provider. Make sure you discuss any questions you have with your health care provider. Document Revised: 05/17/2020 Document Reviewed: 05/17/2020 Elsevier Patient Education  2021 [employer].

## 2021-01-21 ENCOUNTER — Encounter: Payer: Self-pay | Admitting: Internal Medicine

## 2021-01-21 LAB — HIV ANTIBODY (ROUTINE TESTING W REFLEX): HIV 1&2 Ab, 4th Generation: NONREACTIVE

## 2021-01-21 LAB — RPR: RPR Ser Ql: NONREACTIVE

## 2021-01-21 LAB — HEPATITIS C ANTIBODY
Hepatitis C Ab: NONREACTIVE
SIGNAL TO CUT-OFF: 0.03 (ref <1.00)

## 2021-01-21 NOTE — Assessment & Plan Note (Signed)
Here for CPX, doing well RTC 1 year 

## 2021-01-21 NOTE — Assessment & Plan Note (Signed)
Tdap 2013 Again declined any and all  shots. Patient location: -Diet and exercise discussed. -Also stressed the importance of safe sex and STD.  Labs: CMP, FLP, CBC, TSH.  Also HIV RPR hep C.  Would like to be checked for G&C, at this time the only available option at this office is urethral swab, declined, he has no symptoms, check a UA.  RTC 1 year

## 2021-03-09 ENCOUNTER — Encounter: Payer: Self-pay | Admitting: Internal Medicine

## 2021-07-04 ENCOUNTER — Ambulatory Visit (HOSPITAL_COMMUNITY)
Admission: EM | Admit: 2021-07-04 | Discharge: 2021-07-04 | Disposition: A | Discharge status: Home / Self Care | Payer: Medicaid Other | Attending: Internal Medicine | Admitting: Internal Medicine

## 2021-07-04 ENCOUNTER — Other Ambulatory Visit: Payer: Self-pay

## 2021-07-04 ENCOUNTER — Encounter (HOSPITAL_COMMUNITY): Payer: Self-pay

## 2021-07-04 DIAGNOSIS — Z113 Encounter for screening for infections with a predominantly sexual mode of transmission: Secondary | ICD-10-CM | POA: Insufficient documentation

## 2021-07-04 NOTE — ED Provider Notes (Signed)
[facility]    CSN: [id number] Arrival date & time: 07/04/21  1445      History   Chief Complaint Chief Complaint  Patient presents with   std testing    HPI Ronald Patrick is a 30 y.o. male.   Patient reports to urgent care for routine STD testing.  Patient states that he suspects that wife has not been faithful, so he would like to be tested to ensure that he does not have any type of infection.  Denies any current symptoms including penile discharge or penile irritation.  Patient would like to be tested for gonorrhea, chlamydia, trichomonas, and HIV.  Denies any known exposure to STD.    Past Medical History:  Diagnosis Date   Allergic rhinitis    Chronic tonsillitis    Lisfranc dislocation    L    Patient Active Problem List   Diagnosis Date Noted   PCP NOTES >>>>>>>>>>>> 08/06/2019   Lisfranc dislocation 08/04/2019   Annual physical exam 04/29/2013   Allergic rhinitis    CHRONIC TONSILLITIS 04/08/2008    Past Surgical History:  Procedure Laterality Date   NO PAST SURGERIES         Home Medications    Prior to Admission medications   Medication Sig Start Date End Date Taking? Authorizing Provider  azithromycin (ZITHROMAX) 250 MG tablet Take 2 tablets by mouth on day 1, followed by 1 tablet by mouth daily for 4 days. Patient not taking: Reported on 01/20/2021 11/10/20   Saguier, Ramon Dredge, PA-C  fluticasone [facility]) 50 MCG/ACT nasal spray Place 2 sprays into both nostrils daily. Patient not taking: Reported on 01/20/2021 11/10/20   Saguier, Ramon Dredge, PA-C  levocetirizine (XYZAL) 5 MG tablet Take 1 tablet (5 mg total) by mouth every evening. Patient not taking: Reported on 01/20/2021 11/10/20   Saguier, Ramon Dredge, PA-C  sildenafil (REVATIO) 20 MG tablet Take 3-4 tablets (60-80 mg total) by mouth at bedtime as needed. Patient not taking: No sig reported 08/05/19   Wanda Plump, MD    Family History Family History  Problem Relation Age of Onset    Diabetes Other        GM, late onset   CAD Neg Hx    Colon cancer Neg Hx    Prostate cancer Neg Hx     Social History Social History   Tobacco Use   Smoking status: Every Day    Pack years: 0.00    Types: Cigars   Smokeless tobacco: Never   Tobacco comments:    1-2 cigars a day   Substance Use Topics   Alcohol use: Yes    Comment: occ   Drug use: No     Allergies   Patient has no known allergies.   Review of Systems Review of Systems Per HPI  Physical Exam Triage Vital Signs ED Triage Vitals  Enc Vitals Group     BP 07/04/21 1617 123/79     Pulse Rate 07/04/21 1617 74     Resp 07/04/21 1617 19     Temp 07/04/21 1617 98.3 F (36.8 C)     Temp Source 07/04/21 1617 Oral     SpO2 07/04/21 1617 100 %     Weight --      Height --      Head Circumference --      Peak Flow --      Pain Score 07/04/21 1618 0     Pain Loc --  Pain Edu? --      Excl. in GC? --    No data found.  Updated Vital Signs BP 123/79 (BP Location: Left Arm)   Pulse 74   Temp 98.3 F (36.8 C) (Oral)   Resp 19   SpO2 100%   Visual Acuity Right Eye Distance:   Left Eye Distance:   Bilateral Distance:    Right Eye Near:   Left Eye Near:    Bilateral Near:     Physical Exam Constitutional:      General: He is not in acute distress.    Appearance: Normal appearance.  HENT:     Head: Normocephalic and atraumatic.  Eyes:     Extraocular Movements: Extraocular movements intact.     Conjunctiva/sclera: Conjunctivae normal.  Cardiovascular:     Rate and Rhythm: Normal rate and regular rhythm.     Pulses: Normal pulses.     Heart sounds: Normal heart sounds.  Pulmonary:     Effort: Pulmonary effort is normal. No respiratory distress.     Breath sounds: Normal breath sounds.  Genitourinary:    Comments: Deferred with shared decision making.  Self swab performed. Skin:    General: Skin is warm and dry.  Neurological:     General: No focal deficit present.     Mental  Status: He is alert and oriented to person, place, and time. Mental status is at baseline.  Psychiatric:        Mood and Affect: Mood normal.        Behavior: Behavior normal.        Thought Content: Thought content normal.        Judgment: Judgment normal.     UC Treatments / Results  Labs (all labs ordered are listed, but only abnormal results are displayed) Labs Reviewed  CYTOLOGY, (ORAL, ANAL, URETHRAL) ANCILLARY ONLY    EKG   Radiology No results found.  Procedures Procedures (including critical care time)  Medications Ordered in UC Medications - No data to display  Initial Impression / Assessment and Plan / UC Course  I have reviewed the triage vital signs and the nursing notes.  Pertinent labs & imaging results that were available during my care of the patient were reviewed by me and considered in my medical decision making (see chart for details).     STD tests for gonorrhea, chlamydia, trichomonas pending.  will call patient and treat appropriately if any results are positive.  Unable to obtain blood work in urgent care today to test for HIV.  Patient states that he would like to follow-up with PCP for HIV testing.  Patient to abstain from any sexual activity until test results are complete.  Discussed strict return precautions. Patient verbalized understanding and is agreeable with plan.  Final Clinical Impressions(s) / UC Diagnoses   Final diagnoses:  Screening for STD (sexually transmitted disease)     Discharge Instructions      You have been tested for gonorrhea, chlamydia, and trichomonas. We will call if any of these tests are positive.  Please follow-up with PCP if you still wish to be tested for HIV since it was unable to be obtained in urgent care.     ED Prescriptions   None    PDMP not reviewed this encounter.   Lance Muss, FNP 07/04/21 1721

## 2021-07-04 NOTE — ED Triage Notes (Signed)
Pt in for std testing  Denies any penile sx

## 2021-07-04 NOTE — Discharge Instructions (Addendum)
You have been tested for gonorrhea, chlamydia, and trichomonas. We will call if any of these tests are positive.  Please follow-up with PCP if you still wish to be tested for HIV since it was unable to be obtained in urgent care.

## 2021-07-05 LAB — CYTOLOGY, (ORAL, ANAL, URETHRAL) ANCILLARY ONLY
Chlamydia: NEGATIVE
Comment: NEGATIVE
Comment: NEGATIVE
Comment: NORMAL
Neisseria Gonorrhea: NEGATIVE
Trichomonas: NEGATIVE

## 2021-11-02 ENCOUNTER — Encounter: Payer: Self-pay | Admitting: Internal Medicine

## 2021-11-02 ENCOUNTER — Other Ambulatory Visit: Payer: Self-pay

## 2021-11-02 ENCOUNTER — Ambulatory Visit (INDEPENDENT_AMBULATORY_CARE_PROVIDER_SITE_OTHER): Payer: Medicaid Other | Admitting: Internal Medicine

## 2021-11-02 VITALS — BP 102/80 | HR 86 | Temp 98.7°F | Resp 16 | Ht 73.0 in | Wt 210.6 lb

## 2021-11-02 DIAGNOSIS — R5081 Fever presenting with conditions classified elsewhere: Secondary | ICD-10-CM

## 2021-11-02 DIAGNOSIS — Z20828 Contact with and (suspected) exposure to other viral communicable diseases: Secondary | ICD-10-CM

## 2021-11-02 LAB — POCT INFLUENZA A/B
Influenza A, POC: NEGATIVE
Influenza B, POC: NEGATIVE

## 2021-11-02 MED ORDER — OSELTAMIVIR PHOSPHATE 75 MG PO CAPS: 75.0000 mg | ORAL_CAPSULE | Freq: Every day | ORAL | 0 refills | Status: AC

## 2021-11-02 NOTE — Progress Notes (Signed)
   Subjective:    Patient ID: Ronald Patrick, male    DOB: 14-Dec-1991, 30 y.o.   MRN: [id number]  DOS:  11/02/2021 Type of visit - description: Acute  The patient's child got sick, "1 or 2 days ago he went to the doctor and he was diagnosed with influenza".  Ronald Patrick himself feels well. No cough, no nausea or vomiting. No myalgias. He did say that a couple days ago his temperature was a little high, 100?  101?   Review of Systems See above   Past Medical History:  Diagnosis Date   Allergic rhinitis    Chronic tonsillitis    Lisfranc dislocation    L    Past Surgical History:  Procedure Laterality Date   NO PAST SURGERIES      Allergies as of 11/02/2021   No Known Allergies      Medication List        Accurate as of November 02, 2021 11:59 PM. If you have any questions, ask your nurse or doctor.          azithromycin 250 MG tablet Commonly known as: ZITHROMAX Take 2 tablets by mouth on day 1, followed by 1 tablet by mouth daily for 4 days.   fluticasone 50 MCG/ACT nasal spray Commonly known as: FLONASE Place 2 sprays into both nostrils daily.   levocetirizine 5 MG tablet Commonly known as: XYZAL Take 1 tablet (5 mg total) by mouth every evening.   oseltamivir 75 MG capsule Commonly known as: Tamiflu Take 1 capsule (75 mg total) by mouth daily. Started by: Willow Ora, MD   sildenafil 20 MG tablet Commonly known as: REVATIO Take 3-4 tablets (60-80 mg total) by mouth at bedtime as needed.           Objective:   Physical Exam BP 102/80 (BP Location: Left Arm, Patient Position: Sitting, Cuff Size: Normal)   Pulse 86   Temp 98.7 F (37.1 C) (Oral)   Resp 16   Ht 6\' 1"  (1.854 m)   Wt 210 lb 9.6 oz (95.5 kg)   SpO2 97%   BMI 27.79 kg/m  General:   Well developed, NAD, BMI noted. HEENT:  Normocephalic . Face symmetric, atraumatic. TMs: Normal Throat: Symmetric, not red. Nose no congestion. Lungs:  CTA B Normal respiratory effort, no intercostal  retractions, no accessory muscle use. Heart: RRR,  no murmur.  Lower extremities: no pretibial edema bilaterally  Skin: Not pale. Not jaundice Neurologic:  alert & oriented X3.  Speech normal, gait appropriate for age and unassisted Psych--  Cognition and judgment appear intact.  Cooperative with normal attention span and concentration.  Behavior appropriate. No anxious or depressed appearing.      Assessment      Assessment (new patient, reestablished 07-2019) Healthy  + Covid dz 10-2020  PLAN Influenza exposure: Was exposed to influenza, flu test today negative, rec Tamiflu x10 days.  Call if he has symptoms Febrile illness?  Low-grade temperature 2 days ago?  See HPI, recommend observation, fluids, Tylenol. Preventive care: He remains reluctant to get the flu or a COVID-vaccine.     This visit occurred during the SARS-CoV-2 public health emergency.  Safety protocols were in place, including screening questions prior to the visit, additional usage of staff PPE, and extensive cleaning of exam room while observing appropriate contact time as indicated for disinfecting solutions.

## 2021-11-02 NOTE — Patient Instructions (Signed)
Start taking Tamiflu 1 tablet daily x10 days.  If you develop any influenza symptoms let me know  Okay to go back to work tomorrow if you have no fever or other problems.

## 2021-11-03 NOTE — Assessment & Plan Note (Signed)
Influenza exposure: Was exposed to influenza, flu test today negative, rec Tamiflu x10 days.  Call if he has symptoms Febrile illness?  Low-grade temperature 2 days ago?  See HPI, recommend observation, fluids, Tylenol. Preventive care: He remains reluctant to get the flu or a COVID-vaccine.

## 2022-02-17 ENCOUNTER — Encounter: Payer: Self-pay | Admitting: Internal Medicine

## 2022-05-10 ENCOUNTER — Other Ambulatory Visit: Payer: Self-pay

## 2022-05-10 ENCOUNTER — Emergency Department (HOSPITAL_BASED_OUTPATIENT_CLINIC_OR_DEPARTMENT_OTHER): Payer: Medicaid Other

## 2022-05-10 ENCOUNTER — Encounter (HOSPITAL_BASED_OUTPATIENT_CLINIC_OR_DEPARTMENT_OTHER): Payer: Self-pay

## 2022-05-10 ENCOUNTER — Emergency Department (HOSPITAL_BASED_OUTPATIENT_CLINIC_OR_DEPARTMENT_OTHER)
Admission: EM | Admit: 2022-05-10 | Discharge: 2022-05-11 | Disposition: A | Discharge status: Home / Self Care | Payer: Medicaid Other | Attending: Emergency Medicine | Admitting: Emergency Medicine

## 2022-05-10 DIAGNOSIS — R0789 Other chest pain: Secondary | ICD-10-CM | POA: Insufficient documentation

## 2022-05-10 LAB — BASIC METABOLIC PANEL
Anion gap: 6 (ref 5–15)
BUN: 20 mg/dL (ref 6–20)
CO2: 26 mmol/L (ref 22–32)
Calcium: 9.3 mg/dL (ref 8.9–10.3)
Chloride: 104 mmol/L (ref 98–111)
Creatinine, Ser: 1.21 mg/dL (ref 0.61–1.24)
GFR, Estimated: >60 mL/min (ref >60)
Glucose, Bld: 117 mg/dL — ABNORMAL HIGH (ref 70–99)
Potassium: 3.7 mmol/L (ref 3.5–5.1)
Sodium: 136 mmol/L (ref 135–145)

## 2022-05-10 LAB — CBC
HCT: 44.3 % (ref 39.0–52.0)
Hemoglobin: 15.2 g/dL (ref 13.0–17.0)
MCH: 32 pg (ref 26.0–34.0)
MCHC: 34.3 g/dL (ref 30.0–36.0)
MCV: 93.3 fL (ref 80.0–100.0)
Platelets: 284 10*3/uL (ref 150–400)
RBC: 4.75 MIL/uL (ref 4.22–5.81)
RDW: 12.5 % (ref 11.5–15.5)
WBC: 9.7 10*3/uL (ref 4.0–10.5)
nRBC: 0 % (ref 0.0–0.2)

## 2022-05-10 LAB — TROPONIN I (HIGH SENSITIVITY): Troponin I (High Sensitivity): 2 ng/L (ref <18)

## 2022-05-10 NOTE — ED Triage Notes (Addendum)
Pt presents with mid sternal chest pain that he noticed when he woke up from a nap today. Pain is described as sharp. Pt states the pain is worse with movement and when he "flexes in that area." Pt denies ShOB, nausea, or diaphoresis.  ?

## 2022-05-11 ENCOUNTER — Encounter (HOSPITAL_BASED_OUTPATIENT_CLINIC_OR_DEPARTMENT_OTHER): Payer: Self-pay | Admitting: Emergency Medicine

## 2022-05-11 LAB — D-DIMER, QUANTITATIVE: D-Dimer, Quant: <0.27 ug/mL-FEU (ref 0.00–0.50)

## 2022-05-11 LAB — TROPONIN I (HIGH SENSITIVITY): Troponin I (High Sensitivity): 2 ng/L (ref <18)

## 2022-05-11 MED ORDER — IBUPROFEN 800 MG PO TABS
800.0000 mg | ORAL_TABLET | Freq: Once | ORAL | Status: AC
  Administered 2022-05-11: 800 mg via ORAL
  Filled 2022-05-11: qty 1

## 2022-05-11 MED ORDER — ACETAMINOPHEN 500 MG PO TABS
1000.0000 mg | ORAL_TABLET | Freq: Once | ORAL | Status: AC
  Administered 2022-05-11: 1000 mg via ORAL
  Filled 2022-05-11: qty 2

## 2022-05-11 NOTE — ED Provider Notes (Signed)
[facility] EMERGENCY DEPARTMENT Provider Note   CSN: [id number] Arrival date & time: 05/10/22  2155     History  Chief Complaint  Patient presents with   Chest Pain    Ronald Patrick is a 31 y.o. male.  The history is provided by the patient.  Chest Pain Pain location:  L chest Pain quality: sharp   Pain radiates to:  Does not radiate Pain severity:  Moderate Onset quality:  Gradual Duration:  1 day Timing:  Constant Progression:  Waxing and waning Chronicity:  New Context: lifting and raising an arm   Relieved by:  Nothing Worsened by:  Nothing Ineffective treatments:  None tried Associated symptoms: no altered mental status, no anorexia, no anxiety, no back pain, no cough, no diaphoresis, no fever, no heartburn, no nausea, no palpitations, no PND, no shortness of breath, no syncope, no vomiting and no weakness   Risk factors: no high cholesterol   Patient with new work out regimen.  No with pain with movement and lifting.  No DOE.  No SOB.  No n/v/d.  No leg pain, no surgery.  No travel.      Home Medications Prior to Admission medications   Medication Sig Start Date End Date Taking? Authorizing Provider  azithromycin (ZITHROMAX) 250 MG tablet Take 2 tablets by mouth on day 1, followed by 1 tablet by mouth daily for 4 days. Patient not taking: No sig reported 11/10/20   Saguier, Ramon DredgeEdward, PA-C  fluticasone [facility](FLONASE) 50 MCG/ACT nasal spray Place 2 sprays into both nostrils daily. Patient not taking: No sig reported 11/10/20   Saguier, Ramon DredgeEdward, PA-C  levocetirizine (XYZAL) 5 MG tablet Take 1 tablet (5 mg total) by mouth every evening. Patient not taking: No sig reported 11/10/20   Saguier, Ramon DredgeEdward, PA-C  oseltamivir (TAMIFLU) 75 MG capsule Take 1 capsule (75 mg total) by mouth daily. 11/02/21   Wanda PlumpPaz, Jose E, MD  sildenafil (REVATIO) 20 MG tablet Take 3-4 tablets (60-80 mg total) by mouth at bedtime as needed. Patient not taking: No sig reported 08/05/19   Wanda PlumpPaz, Jose  E, MD      Allergies    Patient has no known allergies.    Review of Systems   Review of Systems  Constitutional:  Negative for diaphoresis and fever.  HENT:  Negative for facial swelling.   Eyes:  Negative for redness.  Respiratory:  Negative for cough and shortness of breath.   Cardiovascular:  Positive for chest pain. Negative for palpitations, syncope and PND.  Gastrointestinal:  Negative for anorexia, heartburn, nausea and vomiting.  Musculoskeletal:  Negative for back pain.  Neurological:  Negative for facial asymmetry and weakness.  All other systems reviewed and are negative.  Physical Exam Updated Vital Signs BP (!) 141/85   Pulse 63   Temp 98.5 F (36.9 C) (Oral)   Resp 13   Ht 6\' 1"  (1.854 m)   Wt 99.8 kg   SpO2 96%   BMI 29.03 kg/m  Physical Exam Vitals and nursing note reviewed.  Constitutional:      General: He is not in acute distress.    Appearance: Normal appearance.  HENT:     Head: Normocephalic and atraumatic.     Nose: Nose normal.  Eyes:     Conjunctiva/sclera: Conjunctivae normal.     Pupils: Pupils are equal, round, and reactive to light.  Cardiovascular:     Rate and Rhythm: Normal rate and regular rhythm.     Pulses: Normal  pulses.     Heart sounds: Normal heart sounds.  Pulmonary:     Effort: Pulmonary effort is normal.     Breath sounds: Normal breath sounds.  Chest:    Abdominal:     General: Bowel sounds are normal.     Palpations: Abdomen is soft.     Tenderness: There is no abdominal tenderness. There is no guarding.  Musculoskeletal:        General: Normal range of motion.     Cervical back: Normal range of motion and neck supple.  Skin:    General: Skin is warm and dry.     Capillary Refill: Capillary refill takes less than 2 seconds.  Neurological:     General: No focal deficit present.     Mental Status: He is alert and oriented to person, place, and time.     Deep Tendon Reflexes: Reflexes normal.  Psychiatric:         Mood and Affect: Mood normal.        Behavior: Behavior normal.    ED Results / Procedures / Treatments   Labs (all labs ordered are listed, but only abnormal results are displayed) Results for orders placed or performed during the hospital encounter of 05/10/22  Basic metabolic panel  Result Value Ref Range   Sodium 136 135 - 145 mmol/L   Potassium 3.7 3.5 - 5.1 mmol/L   Chloride 104 98 - 111 mmol/L   CO2 26 22 - 32 mmol/L   Glucose, Bld 117 (H) 70 - 99 mg/dL   BUN 20 6 - 20 mg/dL   Creatinine, Ser 6.04 0.61 - 1.24 mg/dL   Calcium 9.3 8.9 - 54.0 mg/dL   GFR, Estimated >98 >11 mL/min   Anion gap 6 5 - 15  CBC  Result Value Ref Range   WBC 9.7 4.0 - 10.5 K/uL   RBC 4.75 4.22 - 5.81 MIL/uL   Hemoglobin 15.2 13.0 - 17.0 g/dL   HCT 91.4 78.2 - 95.6 %   MCV 93.3 80.0 - 100.0 fL   MCH 32.0 26.0 - 34.0 pg   MCHC 34.3 30.0 - 36.0 g/dL   RDW 21.3 08.6 - 57.8 %   Platelets 284 150 - 400 K/uL   nRBC 0.0 0.0 - 0.2 %  D-dimer, quantitative  Result Value Ref Range   D-Dimer, Quant <0.27 0.00 - 0.50 ug/mL-FEU  Troponin I (High Sensitivity)  Result Value Ref Range   Troponin I (High Sensitivity) 2 <18 ng/L  Troponin I (High Sensitivity)  Result Value Ref Range   Troponin I (High Sensitivity) 2 <18 ng/L   DG Chest 2 View  Result Date: 05/10/2022 CLINICAL DATA:  Chest pain. EXAM: CHEST - 2 VIEW COMPARISON:  Chest x-ray 04/17/2015 FINDINGS: The heart size and mediastinal contours are within normal limits. Both lungs are clear. The visualized skeletal structures are unremarkable. IMPRESSION: No active cardiopulmonary disease. Electronically Signed   By: Darliss Cheney M.D.   On: 05/10/2022 22:19     EKG EKG Interpretation  Date/Time:  Wednesday May 10 2022 22:01:58 EDT Ventricular Rate:  70 PR Interval:  142 QRS Duration: 86 QT Interval:  350 QTC Calculation: 378 R Axis:   71 Text Interpretation: Normal sinus rhythm Confirmed by Nicanor Alcon, Aerin Delany ([id number]) on 05/11/2022 12:11:54  AM  Radiology DG Chest 2 View  Result Date: 05/10/2022 CLINICAL DATA:  Chest pain. EXAM: CHEST - 2 VIEW COMPARISON:  Chest x-ray 04/17/2015 FINDINGS: The heart size and mediastinal contours are within  normal limits. Both lungs are clear. The visualized skeletal structures are unremarkable. IMPRESSION: No active cardiopulmonary disease. Electronically Signed   By: Darliss Cheney M.D.   On: 05/10/2022 22:19    Procedures Procedures    Medications Ordered in ED Medications  acetaminophen (TYLENOL) tablet 1,000 mg (1,000 mg Oral Given 05/11/22 0023)  ibuprofen (ADVIL) tablet 800 mg (800 mg Oral Given 05/11/22 0023)    ED Course/ Medical Decision Making/ A&P                           Medical Decision Making Pain with movement  Amount and/or Complexity of Data Reviewed External Data Reviewed: notes.    Details: previous notes reviewed. Labs: ordered.    Details: all labs reviewed: 2 negative troponins 2/2.  Negative ddimer <.27.  Normal platelets 284,000.  normal chemistry panel sodium 136, normal potassium 3.7 and normal creatinine 1.21. Radiology: ordered and independent interpretation performed.    Details: No acute findings on CXR by me ECG/medicine tests: independent interpretation performed. Decision-making details documented in ED Course.  Risk OTC drugs. Prescription drug management. Risk Details: Ruled out for MI with normal ekg and 2 negative troponins. History was not consistent with cardiac etiology.  HEART score is 1 very low risk for MACE.  PERC negative wells 0, highly doubt PE in this low risk patient.      Final Clinical Impression(s) / ED Diagnoses Final diagnoses:  None  Return for intractable cough, coughing up blood, fevers > 100.4 unrelieved by medication, shortness of breath, intractable vomiting, chest pain, shortness of breath, weakness, numbness, changes in speech, facial asymmetry, abdominal pain, passing out, Inability to tolerate liquids or food, cough,  altered mental status or any concerns. No signs of systemic illness or infection. The patient is nontoxic-appearing on exam and vital signs are within normal limits.  I have reviewed the triage vital signs and the nursing notes. Pertinent labs & imaging results that were available during my care of the patient were reviewed by me and considered in my medical decision making (see chart for details). After history, exam, and medical workup I feel the patient has been appropriately medically screened and is safe for discharge home. Pertinent diagnoses were discussed with the patient. Patient was given return precautions.       Rx / DC Orders ED Discharge Orders     None         Logann Whitebread, MD 05/11/22 [id number]

## 2023-02-09 ENCOUNTER — Encounter (HOSPITAL_BASED_OUTPATIENT_CLINIC_OR_DEPARTMENT_OTHER): Payer: Self-pay | Admitting: Emergency Medicine

## 2023-02-09 ENCOUNTER — Other Ambulatory Visit: Payer: Self-pay

## 2023-02-09 ENCOUNTER — Emergency Department (HOSPITAL_BASED_OUTPATIENT_CLINIC_OR_DEPARTMENT_OTHER)
Admission: EM | Admit: 2023-02-09 | Discharge: 2023-02-09 | Disposition: A | Discharge status: Home / Self Care | Payer: Medicaid Other | Attending: Emergency Medicine | Admitting: Emergency Medicine

## 2023-02-09 DIAGNOSIS — U071 COVID-19: Secondary | ICD-10-CM | POA: Diagnosis not present

## 2023-02-09 DIAGNOSIS — R112 Nausea with vomiting, unspecified: Secondary | ICD-10-CM | POA: Diagnosis present

## 2023-02-09 DIAGNOSIS — D72829 Elevated white blood cell count, unspecified: Secondary | ICD-10-CM | POA: Insufficient documentation

## 2023-02-09 LAB — CBC WITH DIFFERENTIAL/PLATELET
Abs Immature Granulocytes: 0.05 10*3/uL (ref 0.00–0.07)
Basophils Absolute: 0 10*3/uL (ref 0.0–0.1)
Basophils Relative: 0 %
Eosinophils Absolute: 0 10*3/uL (ref 0.0–0.5)
Eosinophils Relative: 0 %
HCT: 45.8 % (ref 39.0–52.0)
Hemoglobin: 15.5 g/dL (ref 13.0–17.0)
Immature Granulocytes: 0 %
Lymphocytes Relative: 4 %
Lymphs Abs: 0.6 10*3/uL — ABNORMAL LOW (ref 0.7–4.0)
MCH: 31.4 pg (ref 26.0–34.0)
MCHC: 33.8 g/dL (ref 30.0–36.0)
MCV: 92.7 fL (ref 80.0–100.0)
Monocytes Absolute: 0.5 10*3/uL (ref 0.1–1.0)
Monocytes Relative: 4 %
Neutro Abs: 13.1 10*3/uL — ABNORMAL HIGH (ref 1.7–7.7)
Neutrophils Relative %: 92 %
Platelets: 290 10*3/uL (ref 150–400)
RBC: 4.94 MIL/uL (ref 4.22–5.81)
RDW: 12.7 % (ref 11.5–15.5)
WBC: 14.2 10*3/uL — ABNORMAL HIGH (ref 4.0–10.5)
nRBC: 0 % (ref 0.0–0.2)

## 2023-02-09 LAB — COMPREHENSIVE METABOLIC PANEL
ALT: 46 U/L — ABNORMAL HIGH (ref 0–44)
AST: 31 U/L (ref 15–41)
Albumin: 4.5 g/dL (ref 3.5–5.0)
Alkaline Phosphatase: 33 U/L — ABNORMAL LOW (ref 38–126)
Anion gap: 9 (ref 5–15)
BUN: 20 mg/dL (ref 6–20)
CO2: 23 mmol/L (ref 22–32)
Calcium: 8.9 mg/dL (ref 8.9–10.3)
Chloride: 102 mmol/L (ref 98–111)
Creatinine, Ser: 1.26 mg/dL — ABNORMAL HIGH (ref 0.61–1.24)
GFR, Estimated: >60 mL/min (ref >60)
Glucose, Bld: 113 mg/dL — ABNORMAL HIGH (ref 70–99)
Potassium: 4.1 mmol/L (ref 3.5–5.1)
Sodium: 134 mmol/L — ABNORMAL LOW (ref 135–145)
Total Bilirubin: 0.7 mg/dL (ref 0.3–1.2)
Total Protein: 8 g/dL (ref 6.5–8.1)

## 2023-02-09 LAB — URINALYSIS, ROUTINE W REFLEX MICROSCOPIC
Bilirubin Urine: NEGATIVE
Glucose, UA: NEGATIVE mg/dL
Hgb urine dipstick: NEGATIVE
Ketones, ur: NEGATIVE mg/dL
Leukocytes,Ua: NEGATIVE
Nitrite: NEGATIVE
Protein, ur: NEGATIVE mg/dL
Specific Gravity, Urine: 1.025 (ref 1.005–1.030)
pH: 6 (ref 5.0–8.0)

## 2023-02-09 LAB — RESP PANEL BY RT-PCR (RSV, FLU A&B, COVID)  RVPGX2
Influenza A by PCR: NEGATIVE
Influenza B by PCR: NEGATIVE
Resp Syncytial Virus by PCR: NEGATIVE
SARS Coronavirus 2 by RT PCR: POSITIVE — AB

## 2023-02-09 LAB — LIPASE, BLOOD: Lipase: 25 U/L (ref 11–51)

## 2023-02-09 MED ORDER — SUCRALFATE 1 G PO TABS: 1.0000 g | ORAL_TABLET | Freq: Three times a day (TID) | ORAL | 0 refills | Status: AC

## 2023-02-09 MED ORDER — PANTOPRAZOLE SODIUM 20 MG PO TBEC: 20.0000 mg | DELAYED_RELEASE_TABLET | Freq: Every day | ORAL | 0 refills | Status: DC

## 2023-02-09 MED ORDER — PANTOPRAZOLE SODIUM 20 MG PO TBEC
20.0000 mg | DELAYED_RELEASE_TABLET | Freq: Every day | ORAL | 0 refills | Status: AC
Start: 2023-02-09 — End: 2023-02-23

## 2023-02-09 MED ORDER — ONDANSETRON HCL 4 MG/2ML IJ SOLN
4.0000 mg | Freq: Once | INTRAMUSCULAR | Status: DC
Start: 2023-02-09 — End: 2023-02-09

## 2023-02-09 MED ORDER — ONDANSETRON 4 MG PO TBDP: 4.0000 mg | ORAL_TABLET | Freq: Three times a day (TID) | ORAL | 0 refills | Status: DC | PRN

## 2023-02-09 MED ORDER — SUCRALFATE 1 G PO TABS: 1.0000 g | ORAL_TABLET | Freq: Three times a day (TID) | ORAL | 0 refills | Status: DC

## 2023-02-09 MED ORDER — ONDANSETRON 4 MG PO TBDP: 4.0000 mg | ORAL_TABLET | Freq: Three times a day (TID) | ORAL | 0 refills | Status: AC | PRN

## 2023-02-09 MED ORDER — ACETAMINOPHEN 325 MG PO TABS
650.0000 mg | ORAL_TABLET | Freq: Once | ORAL | Status: AC
  Administered 2023-02-09: 650 mg via ORAL
  Filled 2023-02-09: qty 2

## 2023-02-09 MED ORDER — ONDANSETRON 4 MG PO TBDP
4.0000 mg | ORAL_TABLET | Freq: Once | ORAL | Status: AC
  Administered 2023-02-09: 4 mg via ORAL
  Filled 2023-02-09: qty 1

## 2023-02-09 MED ORDER — SODIUM CHLORIDE 0.9 % IV BOLUS: 1000.0000 mL | Freq: Once | INTRAVENOUS | Status: DC

## 2023-02-09 NOTE — Discharge Instructions (Addendum)
I have prescribed Carafate and Protonix to help with inflammation of your stomach.  I prescribed you Zofran to help with nausea and vomiting.  Please return if symptoms worsen as discussed.  Continue taking Tylenol 1000 mg every 6 hours as needed for fever.

## 2023-02-09 NOTE — ED Triage Notes (Signed)
Generalized abdominal pain, fatigue and n/v/d since last night. Reports he may have eaten something bad. Describes pain as intermittent, cramping, 6/10.

## 2023-02-09 NOTE — ED Provider Notes (Signed)
[place] [facility] Provider Note   CSN: [id number] Arrival date & time: 02/09/23  1655     History  Chief Complaint  Patient presents with   Abdominal Pain   Emesis    Ronald Patrick is a 32 y.o. male.  Patient here with nausea vomiting diarrhea, fever.  Had a couple episodes of emesis.  Maybe a little blood-tinged in 1 episode.  Admits to some occasional alcohol use.  No major medical problems otherwise.  Nothing makes it worse or better.  No sick contacts.  Denies any chest pain.  He is feeling much better after throwing up and having diarrhea.  He has been able to drink without any issue since.  The history is provided by the patient.       Home Medications Prior to Admission medications   Medication Sig Start Date End Date Taking? Authorizing Provider  ondansetron (ZOFRAN-ODT) 4 MG disintegrating tablet Take 1 tablet (4 mg total) by mouth every 8 (eight) hours as needed for nausea or vomiting. 02/09/23  Yes Nara Paternoster, DO  pantoprazole (PROTONIX) 20 MG tablet Take 1 tablet (20 mg total) by mouth daily for 14 doses. 02/09/23 02/23/23 Yes Rhett Mutschler, DO  sucralfate (CARAFATE) 1 g tablet Take 1 tablet (1 g total) by mouth 4 (four) times daily -  with meals and at bedtime for 14 days. 02/09/23 02/23/23 Yes Beola Vasallo, DO  azithromycin (ZITHROMAX) 250 MG tablet Take 2 tablets by mouth on day 1, followed by 1 tablet by mouth daily for 4 days. Patient not taking: No sig reported 11/10/20   Saguier, Percell Miller, PA-C  fluticasone [facility]) 50 MCG/ACT nasal spray Place 2 sprays into both nostrils daily. Patient not taking: No sig reported 11/10/20   Saguier, Percell Miller, PA-C  levocetirizine (XYZAL) 5 MG tablet Take 1 tablet (5 mg total) by mouth every evening. Patient not taking: No sig reported 11/10/20   Saguier, Percell Miller, PA-C  oseltamivir (TAMIFLU) 75 MG capsule Take 1 capsule (75 mg total) by mouth daily. 11/02/21   Colon Branch, MD  sildenafil  (REVATIO) 20 MG tablet Take 3-4 tablets (60-80 mg total) by mouth at bedtime as needed. Patient not taking: No sig reported 08/05/19   Colon Branch, MD      Allergies    Patient has no known allergies.    Review of Systems   Review of Systems  Physical Exam Updated Vital Signs BP 117/86 (BP Location: Left Arm)   Pulse (!) 120   Temp (!) 101.4 F (38.6 C) (Oral)   Resp 18   Ht 6' 1"$  (1.854 m)   Wt 102.1 kg   SpO2 95%   BMI 29.69 kg/m  Physical Exam Vitals and nursing note reviewed.  Constitutional:      General: He is not in acute distress.    Appearance: He is well-developed. He is not ill-appearing.  HENT:     Head: Normocephalic and atraumatic.     Mouth/Throat:     Mouth: Mucous membranes are moist.  Eyes:     Extraocular Movements: Extraocular movements intact.     Conjunctiva/sclera: Conjunctivae normal.     Pupils: Pupils are equal, round, and reactive to light.  Cardiovascular:     Rate and Rhythm: Normal rate and regular rhythm.     Heart sounds: Normal heart sounds. No murmur heard. Pulmonary:     Effort: Pulmonary effort is normal. No respiratory distress.     Breath sounds: Normal  breath sounds.  Abdominal:     Palpations: Abdomen is soft.     Tenderness: There is no abdominal tenderness. There is no guarding or rebound.  Musculoskeletal:        General: No swelling.     Cervical back: Neck supple.  Skin:    General: Skin is warm and dry.     Capillary Refill: Capillary refill takes less than 2 seconds.  Neurological:     General: No focal deficit present.     Mental Status: He is alert.  Psychiatric:        Mood and Affect: Mood normal.     ED Results / Procedures / Treatments   Labs (all labs ordered are listed, but only abnormal results are displayed) Labs Reviewed  RESP PANEL BY RT-PCR (RSV, FLU A&B, COVID)  RVPGX2 - Abnormal; Notable for the following components:      Result Value   SARS Coronavirus 2 by RT PCR POSITIVE (*)    All other  components within normal limits  CBC WITH DIFFERENTIAL/PLATELET - Abnormal; Notable for the following components:   WBC 14.2 (*)    Neutro Abs 13.1 (*)    Lymphs Abs 0.6 (*)    All other components within normal limits  COMPREHENSIVE METABOLIC PANEL - Abnormal; Notable for the following components:   Sodium 134 (*)    Glucose, Bld 113 (*)    Creatinine, Ser 1.26 (*)    ALT 46 (*)    Alkaline Phosphatase 33 (*)    All other components within normal limits  LIPASE, BLOOD  URINALYSIS, ROUTINE W REFLEX MICROSCOPIC    EKG None  Radiology No results found.  Procedures Procedures    Medications Ordered in ED Medications  ondansetron (ZOFRAN-ODT) disintegrating tablet 4 mg (has no administration in time range)  acetaminophen (TYLENOL) tablet 650 mg (650 mg Oral Given 02/09/23 1815)    ED Course/ Medical Decision Making/ A&P                             Medical Decision Making Amount and/or Complexity of Data Reviewed Labs: ordered.  Risk OTC drugs. Prescription drug management.   Ronald Patrick is here with nausea vomiting diarrhea and fever.  Had a couple episodes of emesis.  Maybe little bit of blood in 1 episode.  He is feeling much better now.  Does not feel nauseous anymore.  Has been able to drink since having episodes this morning.  He is got a fever and mild tachycardia but otherwise vitals unremarkable.  He is well-appearing.  Patient given Tylenol.  Differential diagnosis likely viral gastroenteritis versus foodborne illness.  He has no abdominal tenderness and no concern for appendicitis or other acute intra-abdominal process.  CBC, CMP, lipase, urinalysis, COVID testing was performed.  Per my review and interpretation there is no significant anemia or electrolyte abnormality or kidney injury.  Hemoglobin is normal.  Mild leukocytosis.  COVID test is positive.  Overall suspect this is a viral gastroenteritis.  He is able to tolerate p.o.  Will prescribe Zofran.   Recommend Tylenol and ibuprofen.  Discharged in good condition.  Will prescribe Carafate and Protonix to help with any stomach inflammation that might be going on as well.  Understands return precautions.  This chart was dictated using voice recognition software.  Despite best efforts to proofread,  errors can occur which can change the documentation meaning.  Final Clinical Impression(s) / ED Diagnoses Final diagnoses:  [id number]    Rx / DC Orders ED Discharge Orders          Ordered    ondansetron (ZOFRAN-ODT) 4 MG disintegrating tablet  Every 8 hours PRN        02/09/23 1841    sucralfate (CARAFATE) 1 g tablet  3 times daily with meals & bedtime        02/09/23 1841    pantoprazole (PROTONIX) 20 MG tablet  Daily        02/09/23 1841              Lennice Sites, DO 02/09/23 1856

## 2023-02-12 ENCOUNTER — Telehealth: Payer: Self-pay

## 2023-02-12 NOTE — Transitions of Care (Post Inpatient/ED Visit) (Signed)
   02/12/2023  Name: KAZUYA CANCILLA MRN: [id number] DOB: Nov 02, 1991  Today's TOC FU Call Status: Today's TOC FU Call Status:: Unsuccessul Call (1st Attempt) Unsuccessful Call (1st Attempt) Date: 02/12/23  Attempted to reach the patient regarding the most recent Inpatient/ED visit.  Follow Up Plan: Additional outreach attempts will be made to reach the patient to complete the Transitions of Care (Post Inpatient/ED visit) call.   [place]

## 2023-04-06 ENCOUNTER — Encounter: Payer: Self-pay | Admitting: Internal Medicine

## 2023-06-07 ENCOUNTER — Other Ambulatory Visit: Payer: Self-pay

## 2023-06-07 ENCOUNTER — Encounter (HOSPITAL_BASED_OUTPATIENT_CLINIC_OR_DEPARTMENT_OTHER): Payer: Self-pay | Admitting: Urology

## 2023-06-07 ENCOUNTER — Emergency Department (HOSPITAL_BASED_OUTPATIENT_CLINIC_OR_DEPARTMENT_OTHER)
Admission: EM | Admit: 2023-06-07 | Discharge: 2023-06-07 | Disposition: A | Discharge status: Home / Self Care | Payer: Medicaid Other | Attending: Emergency Medicine | Admitting: Emergency Medicine

## 2023-06-07 DIAGNOSIS — J069 Acute upper respiratory infection, unspecified: Secondary | ICD-10-CM | POA: Diagnosis not present

## 2023-06-07 DIAGNOSIS — R5383 Other fatigue: Secondary | ICD-10-CM | POA: Diagnosis not present

## 2023-06-07 DIAGNOSIS — R0981 Nasal congestion: Secondary | ICD-10-CM

## 2023-06-07 DIAGNOSIS — Z1152 Encounter for screening for COVID-19: Secondary | ICD-10-CM | POA: Insufficient documentation

## 2023-06-07 LAB — RESP PANEL BY RT-PCR (RSV, FLU A&B, COVID)  RVPGX2
Influenza A by PCR: NEGATIVE
Influenza B by PCR: NEGATIVE
Resp Syncytial Virus by PCR: NEGATIVE
SARS Coronavirus 2 by RT PCR: NEGATIVE

## 2023-06-07 NOTE — Discharge Instructions (Addendum)
You were seen in the emergency department today for nasal congestion and fatigue.  As we discussed you tested negative for flu, COVID, and RSV.  However, I think you likely have a different virus causing your symptoms today. We unfortunately cannot test for them all, but we treat them all the same way.   You can take ibuprofen or Tylenol for pain or fever, and I recommend alternating between the 2.  Make sure that you are drinking lots of fluids and getting plenty of rest. You can take decongestants as long as you take them with lots of water. You can use lozenges or chloraseptic spray as needed for sore throat.   Please use Tylenol or ibuprofen for pain.  You may use 600 mg ibuprofen every 6 hours or 1000 mg of Tylenol every 6 hours.  You may choose to alternate between the 2.  This would be most effective.  Do not exceed 4 g of Tylenol within 24 hours.  Do not exceed 3200 mg ibuprofen within 24 hours.  Continue to monitor how you are doing, and return to the emergency department for new or worsening symptoms such as chest pain, difficulty breathing not related to coughing, fever despite medication, or persistent vomiting or diarrhea.  It has been a pleasure taking care of you today and I hope you begin to feel better soon!

## 2023-06-07 NOTE — ED Provider Notes (Signed)
[place] [facility] Provider Note   CSN: [id number] Arrival date & time: 06/07/23  1259     History  Chief Complaint  Patient presents with   Nasal Congestion    Ronald Patrick is a 32 y.o. male with history of allergic rhinitis, chronic tonsillitis who presents emergency department complaining of sinus congestion and pressure, body aches, and fatigue for the past 2 days.  States that his temperature last night was 99.7 F.  He has been taking some ibuprofen and Tylenol with some relief.  He has been trying to hydrate well.  No cough or sore throat.  HPI     Home Medications Prior to Admission medications   Medication Sig Start Date End Date Taking? Authorizing Provider  azithromycin (ZITHROMAX) 250 MG tablet Take 2 tablets by mouth on day 1, followed by 1 tablet by mouth daily for 4 days. Patient not taking: No sig reported 11/10/20   Saguier, Ramon Dredge, PA-C  fluticasone [facility]) 50 MCG/ACT nasal spray Place 2 sprays into both nostrils daily. Patient not taking: No sig reported 11/10/20   Saguier, Ramon Dredge, PA-C  levocetirizine (XYZAL) 5 MG tablet Take 1 tablet (5 mg total) by mouth every evening. Patient not taking: No sig reported 11/10/20   Saguier, Ramon Dredge, PA-C  ondansetron (ZOFRAN-ODT) 4 MG disintegrating tablet Take 1 tablet (4 mg total) by mouth every 8 (eight) hours as needed for nausea or vomiting. 02/09/23   Curatolo, Adam, DO  oseltamivir (TAMIFLU) 75 MG capsule Take 1 capsule (75 mg total) by mouth daily. 11/02/21   Wanda Plump, MD  pantoprazole (PROTONIX) 20 MG tablet Take 1 tablet (20 mg total) by mouth daily for 14 doses. 02/09/23 02/23/23  Curatolo, Adam, DO  sildenafil (REVATIO) 20 MG tablet Take 3-4 tablets (60-80 mg total) by mouth at bedtime as needed. Patient not taking: No sig reported 08/05/19   Wanda Plump, MD  sucralfate (CARAFATE) 1 g tablet Take 1 tablet (1 g total) by mouth 4 (four) times daily -  with meals and at bedtime for  14 days. 02/09/23 02/23/23  Virgina Norfolk, DO      Allergies    Patient has no known allergies.    Review of Systems   Review of Systems  Constitutional:  Positive for fatigue.  HENT:  Positive for congestion and sinus pressure.   Musculoskeletal:  Positive for myalgias.  All other systems reviewed and are negative.   Physical Exam Updated Vital Signs BP 117/84 (BP Location: Left Arm)   Pulse 94   Temp 98.5 F (36.9 C) (Oral)   Resp 18   Ht 6\' 1"  (1.854 m)   Wt 102.1 kg   SpO2 98%   BMI 29.69 kg/m  Physical Exam Vitals and nursing note reviewed.  Constitutional:      Appearance: Normal appearance.  HENT:     Head: Normocephalic and atraumatic.     Nose: Congestion present.  Eyes:     Conjunctiva/sclera: Conjunctivae normal.  Pulmonary:     Effort: Pulmonary effort is normal. No respiratory distress.  Skin:    General: Skin is warm and dry.  Neurological:     Mental Status: He is alert.  Psychiatric:        Mood and Affect: Mood normal.        Behavior: Behavior normal.     ED Results / Procedures / Treatments   Labs (all labs ordered are listed, but only abnormal results are displayed) Labs  Reviewed  RESP PANEL BY RT-PCR (RSV, FLU A&B, COVID)  RVPGX2    EKG None  Radiology No results found.  Procedures Procedures    Medications Ordered in ED Medications - No data to display  ED Course/ Medical Decision Making/ A&P                             Medical Decision Making  This patient is a 32 y.o. male who presents to the ED for concern of nasal congestion, sinus pressure, fatigue x 2 days.   Differential diagnoses prior to evaluation: The emergent differential diagnosis includes, but is not limited to,  upper respiratory infection, lower respiratory infection, allergies, asthma, irritants, postnasal drip, viral illness, sepsis. This is not an exhaustive differential.   Past Medical History / Co-morbidities / Additional history: Chart reviewed.  Pertinent results include: allergic rhinitis, chronic tonsillitis  Physical Exam: Physical exam performed. The pertinent findings include: Normal vital signs, no acute distress.  Nasal congestion  Lab Tests/Imaging studies: I personally interpreted labs/imaging and the pertinent results include:  respiratory panel negative.     Disposition: After consideration of the diagnostic results and the patients response to treatment, I feel that emergency department workup does not suggest an emergent condition requiring admission or immediate intervention beyond what has been performed at this time. Patient with symptoms consistent with viral URI.  Vitals are stable. No signs of dehydration, tolerating PO's.  Lungs are clear.   The plan is: Patient will be discharged with instructions to orally hydrate, rest, and use over-the-counter medications such as anti-inflammatories such as ibuprofen and Tylenol for fever. The patient is safe for discharge and has been instructed to return immediately for worsening symptoms, change in symptoms or any other concerns.  Final Clinical Impression(s) / ED Diagnoses Final diagnoses:  Nasal congestion  Other fatigue  Viral URI    Rx / DC Orders ED Discharge Orders     None      Portions of this report may have been transcribed using voice recognition software. Every effort was made to ensure accuracy; however, inadvertent computerized transcription errors may be present.    Su Monks, PA-C 06/07/23 1448    Loetta Rough, MD 06/07/23 1513

## 2023-06-07 NOTE — ED Triage Notes (Signed)
Pt states sinus congestion and pressure , body aches and fatigue 2 days ago  States low grade fever at home yesterday, no fever today

## 2024-02-20 ENCOUNTER — Other Ambulatory Visit: Payer: Self-pay

## 2024-02-20 ENCOUNTER — Encounter (HOSPITAL_BASED_OUTPATIENT_CLINIC_OR_DEPARTMENT_OTHER): Payer: Self-pay | Admitting: Emergency Medicine

## 2024-02-20 ENCOUNTER — Emergency Department (HOSPITAL_BASED_OUTPATIENT_CLINIC_OR_DEPARTMENT_OTHER)
Admission: EM | Admit: 2024-02-20 | Discharge: 2024-02-20 | Disposition: A | Discharge status: Home / Self Care | Payer: Medicaid Other | Attending: Emergency Medicine | Admitting: Emergency Medicine

## 2024-02-20 DIAGNOSIS — R197 Diarrhea, unspecified: Secondary | ICD-10-CM | POA: Diagnosis present

## 2024-02-20 LAB — RESP PANEL BY RT-PCR (RSV, FLU A&B, COVID)  RVPGX2
Influenza A by PCR: NEGATIVE
Influenza B by PCR: NEGATIVE
Resp Syncytial Virus by PCR: NEGATIVE
SARS Coronavirus 2 by RT PCR: NEGATIVE

## 2024-02-20 NOTE — ED Provider Notes (Signed)
 [place] [facility] Provider Note   CSN: [id number] Arrival date & time: 02/20/24  1524     History  Chief Complaint  Patient presents with   Diarrhea    Ronald Patrick is a 33 y.o. male with no significant PMH who presents to the ED complaining of diarrhea for the past 4 days.  Patient states that his cousin recently had some similar symptoms.  The first day the patient had a low-grade fever, but that is resolved.  He is not having any abdominal pain, but just some rectal pain/irritation.  He has been tolerating food and fluids, has been sticking to bland foods.  He is having at minimum 5 episodes of diarrhea daily.  He has not had a normal bowel movement since symptoms started 4 days ago.  Has intermittently seen trace amounts of blood when wiping, but no large volume of blood in the toilet bowl.  No nausea or vomiting.  No recent changes in food or medications, no recent travel.   Diarrhea      Home Medications Prior to Admission medications   Medication Sig Start Date End Date Taking? Authorizing Provider  azithromycin (ZITHROMAX) 250 MG tablet Take 2 tablets by mouth on day 1, followed by 1 tablet by mouth daily for 4 days. Patient not taking: No sig reported 11/10/20   Saguier, Ramon Dredge, PA-C  fluticasone [facility]) 50 MCG/ACT nasal spray Place 2 sprays into both nostrils daily. Patient not taking: No sig reported 11/10/20   Saguier, Ramon Dredge, PA-C  levocetirizine (XYZAL) 5 MG tablet Take 1 tablet (5 mg total) by mouth every evening. Patient not taking: No sig reported 11/10/20   Saguier, Ramon Dredge, PA-C  ondansetron (ZOFRAN-ODT) 4 MG disintegrating tablet Take 1 tablet (4 mg total) by mouth every 8 (eight) hours as needed for nausea or vomiting. 02/09/23   Curatolo, Adam, DO  oseltamivir (TAMIFLU) 75 MG capsule Take 1 capsule (75 mg total) by mouth daily. 11/02/21   Wanda Plump, MD  pantoprazole (PROTONIX) 20 MG tablet Take 1 tablet (20 mg total) by  mouth daily for 14 doses. 02/09/23 02/23/23  Curatolo, Adam, DO  sildenafil (REVATIO) 20 MG tablet Take 3-4 tablets (60-80 mg total) by mouth at bedtime as needed. Patient not taking: No sig reported 08/05/19   Wanda Plump, MD  sucralfate (CARAFATE) 1 g tablet Take 1 tablet (1 g total) by mouth 4 (four) times daily -  with meals and at bedtime for 14 days. 02/09/23 02/23/23  Virgina Norfolk, DO      Allergies    Patient has no known allergies.    Review of Systems   Review of Systems  Gastrointestinal:  Positive for diarrhea.       Rectal irritation  All other systems reviewed and are negative.   Physical Exam Updated Vital Signs BP 133/80 (BP Location: Left Arm)   Pulse 86   Temp 98.2 F (36.8 C)   Resp 18   Ht 6\' 1"  (1.854 m)   Wt 102.1 kg   SpO2 98%   BMI 29.69 kg/m  Physical Exam Vitals and nursing note reviewed.  Constitutional:      Appearance: Normal appearance.  HENT:     Head: Normocephalic and atraumatic.  Eyes:     Conjunctiva/sclera: Conjunctivae normal.  Pulmonary:     Effort: Pulmonary effort is normal. No respiratory distress.  Abdominal:     Palpations: Abdomen is soft.     Tenderness: There is  no abdominal tenderness.  Skin:    General: Skin is warm and dry.  Neurological:     Mental Status: He is alert.  Psychiatric:        Mood and Affect: Mood normal.        Behavior: Behavior normal.     ED Results / Procedures / Treatments   Labs (all labs ordered are listed, but only abnormal results are displayed) Labs Reviewed  RESP PANEL BY RT-PCR (RSV, FLU A&B, COVID)  RVPGX2    EKG None  Radiology No results found.  Procedures Procedures    Medications Ordered in ED Medications - No data to display  ED Course/ Medical Decision Making/ A&P                                 Medical Decision Making  This patient is a 33 y.o. male  who presents to the ED for concern of diarrhea x 4 days.   Differential diagnoses prior to evaluation: The  emergent differential diagnosis includes, but is not limited to,  Infectious diarrhea, GI Bleed, Appendicitis, Mesenteric Ischemia, Diverticulitis, endocrine causes (adrenal, thyroid), IBD. This is not an exhaustive differential.   Past Medical History / Co-morbidities / Social History: no significant PMH  Physical Exam: Physical exam performed. The pertinent findings include: Normal vital signs, no acute distress.  Abdomen soft and nontender.  Lab Tests/Imaging studies: I personally interpreted labs/imaging and the pertinent results include: Respiratory panel negative.  I offered patient blood work to include CBC, CMP, and lipase but these were not collected.   Disposition: Patient unable to stay for full workup as he has to go pick his child up from school.  We had a thorough shared decision-making discussion regarding staying for further evaluation.  Overall clinically patient appears very well.  He feels as though his symptoms are slowly starting to improve.  He has not taken any medication for his symptoms, and I think he could strongly benefit from over-the-counter antidiarrheals.  I have very low concern for acute bacterial diarrheal infection, as he is having no bloody or mucousy stools, no longer febrile, and does not appear to be clinically dehydrated.  I feel he is stable for discharge to home, and will treat symptomatically with over-the-counter medications.  He is to return for any new or worsening symptoms.  Recommended follow-up with PCP next week for recheck.  Final Clinical Impression(s) / ED Diagnoses Final diagnoses:  Diarrhea, unspecified type    Rx / DC Orders ED Discharge Orders     None      Portions of this report may have been transcribed using voice recognition software. Every effort was made to ensure accuracy; however, inadvertent computerized transcription errors may be present.    Jeanella Flattery 02/20/24 Tarri Abernethy, MD 02/21/24  1850

## 2024-02-20 NOTE — ED Triage Notes (Signed)
 Pt w/ diarrhea since Sunday; low grade fever per pt; denies abd pain; c/o rectal pain; able to tolerate food

## 2024-02-20 NOTE — Discharge Instructions (Signed)
 You were seen in the emergency department today for diarrhea.  As we discussed, your lab work was reassuring. I suspect that you likely have a stomach virus, which we often call gastroenteritis. We treat this with controlling your symptoms and good hydration.   I recommend using immodium or pepto bismol to slow the diarrhea.   I recommend trying to drink plenty of water and starting with eating bland foods, all as tolerated. You can slowly work your way up to more substantial food after that.  Continue to monitor how you're doing and return to the ER for new or worsening symptoms such as fever, worsening pain, inability to keep anything down, etc.

## 2024-02-20 NOTE — ED Notes (Signed)
 Pt expressed hesitation for lab work due to family needs and needing to leave. Labs held and EDP notified
